# Patient Record
Sex: Female | Born: 1960 | Hispanic: Yes | Marital: Married | Smoking: Never smoker
Health system: Southern US, Community
[De-identification: ages and names within clinical notes are randomized; demographics above are authoritative.]

## PROBLEM LIST (undated history)

## (undated) ENCOUNTER — Ambulatory Visit (HOSPITAL_COMMUNITY): Discharge status: Absent without leave | Payer: BLUE CROSS/BLUE SHIELD

## (undated) DIAGNOSIS — E039 Hypothyroidism, unspecified: Secondary | ICD-10-CM

## (undated) DIAGNOSIS — B019 Varicella without complication: Secondary | ICD-10-CM

## (undated) DIAGNOSIS — F329 Major depressive disorder, single episode, unspecified: Secondary | ICD-10-CM

## (undated) DIAGNOSIS — N816 Rectocele: Secondary | ICD-10-CM

## (undated) DIAGNOSIS — E669 Obesity, unspecified: Secondary | ICD-10-CM

## (undated) DIAGNOSIS — I10 Essential (primary) hypertension: Secondary | ICD-10-CM

## (undated) DIAGNOSIS — F32A Depression, unspecified: Secondary | ICD-10-CM

## (undated) HISTORY — PX: LASER ABLATION: SHX1947

## (undated) HISTORY — PX: NEUROPLASTY / TRANSPOSITION MEDIAN NERVE AT CARPAL TUNNEL: SUR893

## (undated) HISTORY — PX: ABCESS DRAINAGE: SHX399

## (undated) HISTORY — PX: TUBAL LIGATION: SHX77

## (undated) HISTORY — PX: OTHER SURGICAL HISTORY: SHX169

## (undated) HISTORY — PX: FRACTURE SURGERY: SHX138

## (undated) HISTORY — PX: DG 2ND DIDGIT LEFT FOOT: HXRAD1641

## (undated) HISTORY — DX: Essential (primary) hypertension: I10

## (undated) HISTORY — PX: ABDOMINAL HYSTERECTOMY: SHX81

---

## 2004-06-04 ENCOUNTER — Ambulatory Visit: Payer: Self-pay | Admitting: Family Medicine

## 2004-07-23 ENCOUNTER — Ambulatory Visit: Payer: Self-pay | Admitting: Family Medicine

## 2005-03-21 ENCOUNTER — Ambulatory Visit: Payer: Self-pay | Admitting: Otolaryngology

## 2006-12-10 ENCOUNTER — Ambulatory Visit: Payer: Self-pay | Admitting: Family Medicine

## 2007-12-14 ENCOUNTER — Ambulatory Visit: Payer: Self-pay | Admitting: Family Medicine

## 2008-12-29 ENCOUNTER — Ambulatory Visit: Payer: Self-pay | Admitting: Family Medicine

## 2010-01-10 ENCOUNTER — Ambulatory Visit: Payer: Self-pay | Admitting: Internal Medicine

## 2010-12-12 ENCOUNTER — Ambulatory Visit: Payer: Self-pay | Admitting: Internal Medicine

## 2011-03-19 ENCOUNTER — Ambulatory Visit: Payer: Self-pay

## 2011-05-29 ENCOUNTER — Ambulatory Visit: Payer: Self-pay | Admitting: Obstetrics and Gynecology

## 2011-06-03 ENCOUNTER — Ambulatory Visit: Payer: Self-pay | Admitting: Obstetrics and Gynecology

## 2011-06-06 LAB — PATHOLOGY REPORT

## 2012-04-01 ENCOUNTER — Ambulatory Visit: Payer: Self-pay | Admitting: Internal Medicine

## 2013-03-08 ENCOUNTER — Ambulatory Visit: Payer: Self-pay

## 2013-04-06 ENCOUNTER — Ambulatory Visit: Payer: Self-pay

## 2013-11-30 DIAGNOSIS — E039 Hypothyroidism, unspecified: Secondary | ICD-10-CM | POA: Insufficient documentation

## 2013-11-30 DIAGNOSIS — R7989 Other specified abnormal findings of blood chemistry: Secondary | ICD-10-CM | POA: Insufficient documentation

## 2014-05-03 ENCOUNTER — Ambulatory Visit: Payer: Self-pay | Admitting: Internal Medicine

## 2014-09-21 ENCOUNTER — Ambulatory Visit: Payer: Self-pay | Admitting: Podiatry

## 2015-06-13 ENCOUNTER — Other Ambulatory Visit: Payer: Self-pay | Admitting: Internal Medicine

## 2015-06-13 DIAGNOSIS — Z1231 Encounter for screening mammogram for malignant neoplasm of breast: Secondary | ICD-10-CM

## 2015-06-27 ENCOUNTER — Ambulatory Visit
Admission: RE | Admit: 2015-06-27 | Discharge: 2015-06-27 | Disposition: A | Discharge status: Home / Self Care | Payer: 59 | Source: Ambulatory Visit | Attending: Internal Medicine | Admitting: Internal Medicine

## 2015-06-27 DIAGNOSIS — Z1231 Encounter for screening mammogram for malignant neoplasm of breast: Secondary | ICD-10-CM | POA: Diagnosis present

## 2015-10-20 ENCOUNTER — Encounter: Payer: Self-pay | Admitting: *Deleted

## 2015-10-23 ENCOUNTER — Ambulatory Visit
Admission: RE | Admit: 2015-10-23 | Discharge: 2015-10-23 | Disposition: A | Discharge status: Home / Self Care | Payer: 59 | Source: Ambulatory Visit | Attending: Gastroenterology | Admitting: Gastroenterology

## 2015-10-23 ENCOUNTER — Ambulatory Visit: Payer: 59 | Admitting: Anesthesiology

## 2015-10-23 ENCOUNTER — Encounter
Admission: RE | Disposition: A | Discharge status: Home / Self Care | Payer: Self-pay | Source: Ambulatory Visit | Attending: Gastroenterology

## 2015-10-23 DIAGNOSIS — Z79899 Other long term (current) drug therapy: Secondary | ICD-10-CM | POA: Insufficient documentation

## 2015-10-23 DIAGNOSIS — F329 Major depressive disorder, single episode, unspecified: Secondary | ICD-10-CM | POA: Insufficient documentation

## 2015-10-23 DIAGNOSIS — E669 Obesity, unspecified: Secondary | ICD-10-CM | POA: Insufficient documentation

## 2015-10-23 DIAGNOSIS — Z6829 Body mass index (BMI) 29.0-29.9, adult: Secondary | ICD-10-CM | POA: Diagnosis not present

## 2015-10-23 DIAGNOSIS — Z1211 Encounter for screening for malignant neoplasm of colon: Secondary | ICD-10-CM | POA: Diagnosis present

## 2015-10-23 DIAGNOSIS — K219 Gastro-esophageal reflux disease without esophagitis: Secondary | ICD-10-CM | POA: Diagnosis not present

## 2015-10-23 DIAGNOSIS — E039 Hypothyroidism, unspecified: Secondary | ICD-10-CM | POA: Diagnosis not present

## 2015-10-23 DIAGNOSIS — K621 Rectal polyp: Secondary | ICD-10-CM | POA: Diagnosis not present

## 2015-10-23 HISTORY — DX: Hypothyroidism, unspecified: E03.9

## 2015-10-23 HISTORY — DX: Obesity, unspecified: E66.9

## 2015-10-23 HISTORY — DX: Rectocele: N81.6

## 2015-10-23 HISTORY — DX: Major depressive disorder, single episode, unspecified: F32.9

## 2015-10-23 HISTORY — DX: Depression, unspecified: F32.A

## 2015-10-23 HISTORY — DX: Varicella without complication: B01.9

## 2015-10-23 HISTORY — PX: COLONOSCOPY WITH PROPOFOL: SHX5780

## 2015-10-23 SURGERY — COLONOSCOPY WITH PROPOFOL
Anesthesia: General

## 2015-10-23 MED ORDER — SODIUM CHLORIDE 0.9 % IV SOLN: INTRAVENOUS | Status: DC

## 2015-10-23 MED ORDER — PROPOFOL 500 MG/50ML IV EMUL
INTRAVENOUS | Status: DC | PRN
  Administered 2015-10-23: 120 ug/kg/min via INTRAVENOUS

## 2015-10-23 MED ORDER — EPHEDRINE SULFATE 50 MG/ML IJ SOLN
INTRAMUSCULAR | Status: DC | PRN
  Administered 2015-10-23: 10 mg via INTRAVENOUS

## 2015-10-23 MED ORDER — PROPOFOL 10 MG/ML IV BOLUS
INTRAVENOUS | Status: DC | PRN
  Administered 2015-10-23: 80 mg via INTRAVENOUS

## 2015-10-23 MED ORDER — SODIUM CHLORIDE 0.9 % IV SOLN
INTRAVENOUS | Status: DC
  Administered 2015-10-23: 1000 mL via INTRAVENOUS
  Administered 2015-10-23: 10:00:00 via INTRAVENOUS

## 2015-10-23 NOTE — Anesthesia Postprocedure Evaluation (Signed)
Anesthesia Post Note  Patient: Andrea LankCelia M Katzenstein  Procedure(s) Performed: Procedure(s) (LRB): COLONOSCOPY WITH PROPOFOL (N/A)  Patient location during evaluation: Endoscopy Anesthesia Type: General Level of consciousness: awake and alert Pain management: pain level controlled Vital Signs Assessment: post-procedure vital signs reviewed and stable Respiratory status: spontaneous breathing, nonlabored ventilation, respiratory function stable and patient connected to nasal cannula oxygen Cardiovascular status: blood pressure returned to baseline and stable Postop Assessment: no signs of nausea or vomiting Anesthetic complications: no    Last Vitals:  Filed Vitals:   10/23/15 1120 10/23/15 1128  BP: 119/71 114/80  Pulse: 88 88  Temp:    Resp: 17 19    Last Pain:  Filed Vitals:   10/23/15 1129  PainSc: 3                  Jomarie LongsJoseph K Presleigh Feldstein

## 2015-10-23 NOTE — Anesthesia Preprocedure Evaluation (Addendum)
Anesthesia Evaluation  Patient identified by MRN, date of birth, ID band Patient awake    Reviewed: Allergy & Precautions, H&P , NPO status , Patient's Chart, lab work & pertinent test results  History of Anesthesia Complications Negative for: history of anesthetic complications  Airway Mallampati: III  TM Distance: >3 FB Neck ROM: limited    Dental  (+) Poor Dentition, Chipped, Missing, Partial Upper   Pulmonary neg shortness of breath, asthma ,    Pulmonary exam normal breath sounds clear to auscultation       Cardiovascular Exercise Tolerance: Good (-) angina(-) Past MI and (-) DOE negative cardio ROS Normal cardiovascular exam Rhythm:regular Rate:Normal     Neuro/Psych PSYCHIATRIC DISORDERS Depression negative neurological ROS     GI/Hepatic negative GI ROS, Neg liver ROS, neg GERD  ,  Endo/Other  Hypothyroidism   Renal/GU negative Renal ROS  negative genitourinary   Musculoskeletal   Abdominal   Peds  Hematology negative hematology ROS (+)   Anesthesia Other Findings Past Medical History:   Depression                                                   Hypothyroidism                                               Obesity                                                      Rectocele                                                    Chicken pox                                                 Past Surgical History:   FRACTURE SURGERY                                              DG 2ND DIDGIT LEFT FOOT                         Left              TUBAL LIGATION                                                ABDOMINAL HYSTERECTOMY  ABCESS DRAINAGE                                               phlebectomy                                                   NEUROPLASTY / TRANSPOSITION MEDIAN NERVE AT [facility]* Bilateral             BMI    Body Mass Index   29.95 kg/m 2    Signs  and symptoms suggestive of sleep apnea    Reproductive/Obstetrics negative OB ROS                            Anesthesia Physical Anesthesia Plan  ASA: III  Anesthesia Plan: General   Post-op Pain Management:    Induction:   Airway Management Planned:   Additional Equipment:   Intra-op Plan:   Post-operative Plan:   Informed Consent: I have reviewed the patients History and Physical, chart, labs and discussed the procedure including the risks, benefits and alternatives for the proposed anesthesia with the patient or authorized representative who has indicated his/her understanding and acceptance.   Dental Advisory Given  Plan Discussed with: Anesthesiologist, CRNA and Surgeon  Anesthesia Plan Comments:         Anesthesia Quick Evaluation

## 2015-10-23 NOTE — Op Note (Signed)
[facility] Patient Name: Cornell Gaber Procedure Date: 10/23/2015 10:09 AM MRN: [id number] Account #: [id number] Date of Birth: 05-Jan-1961 Admit Type: Outpatient Age: 55 Room: [facility] ENDO ROOM 3 Gender: Female Note Status: Finalized Procedure:            Colonoscopy Indications:          Screening for colorectal malignant neoplasm, This is                        the patient's first colonoscopy Providers:            Christena Deem, MD Referring MD:         Barbette Reichmann, MD (Referring MD) Medicines:            Monitored Anesthesia Care Complications:        No immediate complications. Procedure:            Pre-Anesthesia Assessment:                       - ASA Grade Assessment: II - A patient with mild                        systemic disease.                       After obtaining informed consent, the colonoscope was                        passed under direct vision. Throughout the procedure,                        the patient's blood pressure, pulse, and oxygen                        saturations were monitored continuously. The                        Colonoscope was introduced through the anus and                        advanced to the the cecum, identified by appendiceal                        orifice and ileocecal valve. The colonoscopy was                        performed without difficulty. The patient tolerated the                        procedure well. The quality of the bowel preparation                        was good. Findings:      Two sessile polyps were found in the rectum. The polyps were less than 1       mm in size. These polyps were removed with a cold biopsy forceps.       Resection and retrieval were complete.      The exam was otherwise normal throughout the examined colon.      The retroflexed view of the distal rectum and anal verge was normal and  showed no anal or rectal abnormalities.      The digital rectal  exam was normal. Impression:           - Two less than 1 mm polyps in the rectum, removed with                        a cold biopsy forceps. Resected and retrieved.                       - The distal rectum and anal verge are normal on                        retroflexion view. Recommendation:       - Discharge patient to home.                       - Await pathology results.                       - Telephone GI clinic for pathology results in 1 week. Procedure Code(s):    --- Professional ---                       [phone], Colonoscopy, flexible; with biopsy, single or                        multiple Diagnosis Code(s):    --- Professional ---                       Z12.11, Encounter for screening for malignant neoplasm                        of colon                       K62.1, Rectal polyp CPT copyright 2016 American Medical Association. All rights reserved. The codes documented in this report are preliminary and upon coder review may  be revised to meet current compliance requirements. Christena DeemMartin U Skulskie, MD 10/23/2015 10:46:57 AM This report has been signed electronically. Number of Addenda: 0 Note Initiated On: 10/23/2015 10:09 AM Scope Withdrawal Time: 0 hours 11 minutes 48 seconds  Total Procedure Duration: 0 hours 28 minutes 52 seconds       [facility]

## 2015-10-23 NOTE — Transfer of Care (Signed)
Immediate Anesthesia Transfer of Care Note  Patient: Andrea Hudson  Procedure(s) Performed: Procedure(s): COLONOSCOPY WITH PROPOFOL (N/A)  Patient Location: PACU  Anesthesia Type:General  Level of Consciousness: awake, alert  and oriented  Airway & Oxygen Therapy: Patient Spontanous Breathing  Post-op Assessment: Report given to RN  Post vital signs: Reviewed and stable  Last Vitals:  Filed Vitals:   10/23/15 0941  BP: 136/89  Temp: 37 C  Resp: 17    Complications: No apparent anesthesia complications

## 2015-10-23 NOTE — H&P (Signed)
Outpatient short stay form Pre-procedure 10/23/2015 10:07 AM Andrea DeemMartin U Sharla Tankard MD  Primary Physician: Dr Barbette ReichmannVishwanath Hande  Reason for visit:  Screening colonoscopy  History of present illness:  Patient is a 55 year old female presenting today for a screening colonoscopy. This will first colonoscopy. She tolerated her prep well. She denies use of any aspirin or blood thinning products.    Current facility-administered medications:  .  0.9 %  sodium chloride infusion, , Intravenous, Continuous, Andrea DeemMartin U Aveleen Nevers, MD, Last Rate: 20 mL/hr at 10/23/15 1003, 1,000 mL at 10/23/15 1003 .  0.9 %  sodium chloride infusion, , Intravenous, Continuous, Andrea DeemMartin U Sharonica Kraszewski, MD  Prescriptions prior to admission  Medication Sig Dispense Refill Last Dose  . cholecalciferol (VITAMIN D) 1000 units tablet Take 1,000 Units by mouth daily.     Marland Kitchen. levothyroxine (SYNTHROID, LEVOTHROID) 75 MCG tablet Take 75 mcg by mouth daily before breakfast.        Allergies  Allergen Reactions  . Cough-Cold Medicine [Phenir-Pe-Sod Sal-Caff Cit] Other (See Comments)    Numbness of face     Past Medical History  Diagnosis Date  . Depression   . Hypothyroidism   . Obesity   . Rectocele   . Chicken pox     Review of systems:      Physical Exam    Heart and lungs: Regular rate and rhythm without rub or gallop, lungs are bilaterally clear.    HEENT: Normocephalic atraumatic eyes are anicteric    Other:     Pertinant exam for procedure: Soft nontender nondistended bowel sounds positive normoactive.    Planned proceedures: Colonoscopy and indicated procedures. I have discussed the risks benefits and complications of procedures to include not limited to bleeding, infection, perforation and the risk of sedation and the patient wishes to proceed.    Andrea DeemMartin U Emmabelle Fear, MD Gastroenterology 10/23/2015  10:07 AM

## 2015-10-24 LAB — SURGICAL PATHOLOGY

## 2015-10-25 ENCOUNTER — Encounter: Payer: Self-pay | Admitting: Gastroenterology

## 2016-03-13 ENCOUNTER — Emergency Department
Admission: EM | Admit: 2016-03-13 | Discharge: 2016-03-13 | Disposition: A | Discharge status: Home / Self Care | Payer: 59 | Attending: Emergency Medicine | Admitting: Emergency Medicine

## 2016-03-13 ENCOUNTER — Encounter: Payer: Self-pay | Admitting: Intensive Care

## 2016-03-13 ENCOUNTER — Emergency Department: Payer: 59

## 2016-03-13 DIAGNOSIS — R791 Abnormal coagulation profile: Secondary | ICD-10-CM | POA: Diagnosis not present

## 2016-03-13 DIAGNOSIS — M5414 Radiculopathy, thoracic region: Secondary | ICD-10-CM | POA: Insufficient documentation

## 2016-03-13 DIAGNOSIS — M5410 Radiculopathy, site unspecified: Secondary | ICD-10-CM

## 2016-03-13 DIAGNOSIS — E039 Hypothyroidism, unspecified: Secondary | ICD-10-CM | POA: Insufficient documentation

## 2016-03-13 DIAGNOSIS — Z79899 Other long term (current) drug therapy: Secondary | ICD-10-CM | POA: Insufficient documentation

## 2016-03-13 DIAGNOSIS — M7918 Myalgia, other site: Secondary | ICD-10-CM

## 2016-03-13 DIAGNOSIS — R200 Anesthesia of skin: Secondary | ICD-10-CM | POA: Diagnosis present

## 2016-03-13 LAB — COMPREHENSIVE METABOLIC PANEL
ALK PHOS: 76 U/L (ref 38–126)
ALT: 66 U/L — ABNORMAL HIGH (ref 14–54)
ANION GAP: 8 (ref 5–15)
AST: 64 U/L — ABNORMAL HIGH (ref 15–41)
Albumin: 4.3 g/dL (ref 3.5–5.0)
BILIRUBIN TOTAL: 0.2 mg/dL — AB (ref 0.3–1.2)
BUN: 12 mg/dL (ref 6–20)
CALCIUM: 9 mg/dL (ref 8.9–10.3)
CO2: 25 mmol/L (ref 22–32)
Chloride: 104 mmol/L (ref 101–111)
Creatinine, Ser: 0.61 mg/dL (ref 0.44–1.00)
GFR calc non Af Amer: >60 mL/min (ref >60)
Glucose, Bld: 117 mg/dL — ABNORMAL HIGH (ref 65–99)
Potassium: 4 mmol/L (ref 3.5–5.1)
Sodium: 137 mmol/L (ref 135–145)
TOTAL PROTEIN: 7.8 g/dL (ref 6.5–8.1)

## 2016-03-13 LAB — PROTIME-INR
INR: 0.99
PROTHROMBIN TIME: 13.1 s (ref 11.4–15.2)

## 2016-03-13 LAB — TROPONIN I: Troponin I: <0.03 ng/mL

## 2016-03-13 LAB — CBC
HCT: 40.2 % (ref 35.0–47.0)
Hemoglobin: 13.9 g/dL (ref 12.0–16.0)
MCH: 27.8 pg (ref 26.0–34.0)
MCHC: 34.5 g/dL (ref 32.0–36.0)
MCV: 80.4 fL (ref 80.0–100.0)
Platelets: 259 K/uL (ref 150–440)
RBC: 5 MIL/uL (ref 3.80–5.20)
RDW: 13.5 % (ref 11.5–14.5)
WBC: 6.1 K/uL (ref 3.6–11.0)

## 2016-03-13 LAB — DIFFERENTIAL
Basophils Absolute: 0 K/uL (ref 0–0.1)
Basophils Relative: 0 %
Eosinophils Absolute: 0.1 K/uL (ref 0–0.7)
Eosinophils Relative: 1 %
Lymphocytes Relative: 14 %
Lymphs Abs: 0.9 K/uL — ABNORMAL LOW (ref 1.0–3.6)
Monocytes Absolute: 0.3 K/uL (ref 0.2–0.9)
Monocytes Relative: 6 %
Neutro Abs: 4.8 K/uL (ref 1.4–6.5)
Neutrophils Relative %: 79 %

## 2016-03-13 LAB — APTT: aPTT: 30 seconds (ref 24–36)

## 2016-03-13 MED ORDER — PREDNISONE 20 MG PO TABS: 40.0000 mg | ORAL_TABLET | Freq: Every day | ORAL | 0 refills | Status: DC

## 2016-03-13 MED ORDER — TRAMADOL HCL 50 MG PO TABS: 50.0000 mg | ORAL_TABLET | Freq: Four times a day (QID) | ORAL | 0 refills | Status: DC | PRN

## 2016-03-13 NOTE — ED Notes (Signed)
Pt in via triage with complaints of upper back pain across right and left shoulders with left arm numbness since Saturday night.  Pt denies any chest pain, shortness of breath, dizziness, but does report some nausea this morning.  Pt report pain and numbness have been constant, pt reports taking tramadol and ibuprofen for pain but denies taking anything today.  Pt A/Ox4, tachycardic upon arrival, other vitals WDL, no immediate distress noted at this time.  MD at bedside.

## 2016-03-13 NOTE — ED Notes (Signed)
Patient transported to X-ray 

## 2016-03-13 NOTE — ED Triage Notes (Addendum)
Pt presents to ER with c/o back pain and L and R arm numbness that started X3 days ago. Patient reports L arm numbness is worse and L sided weakness. Pt has moderate grip with R hand and Weak grip with L hand. No weakness noted to bilateral legs. No facial droop noted. Patient reports she feels very anxious. A&O X4. Denies blurry vision. Patient ambulatory to triage

## 2016-03-13 NOTE — ED Provider Notes (Signed)
[address] [facility] Emergency Department Provider Note  Time seen: 1:32 PM  I have reviewed the triage vital signs and the nursing notes. Interpreter used for this evaluation.  HISTORY  Chief Complaint Numbness and Extremity Weakness (L arm)    HPI Andrea Hudson is a 55 y.o. female with a past medical history of depression, hypothyroidism, who presents the emergency department for bilateral upper extremity pain, and left extremity tingling/numbness. According to the patient for the past several weeks she has been experiencing mid to upper back pain with radiation into both of her upper extremities. 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 She also states she ate casing gets a numbness and tingling sensation in the left arm other denies any currently. Patient states the pain is much worse with movement of the arms or back. It is relieved when she rests and she is not at work. She states she has to work 12 hour days and the pain always starts in the mid day and worsens towards the end of the day. Patient describes the pain as moderate, located in the mid back going down both arms. Denies any recent trauma. Denies any incontinence or lower extremity issues.  Past Medical History:  Diagnosis Date  . Chicken pox   . Depression   . Hypothyroidism   . Obesity   . Rectocele     There are no active problems to display for this patient.   Past Surgical History:  Procedure Laterality Date  . ABCESS DRAINAGE    . ABDOMINAL HYSTERECTOMY    . COLONOSCOPY WITH PROPOFOL N/A 10/23/2015   Procedure: COLONOSCOPY WITH PROPOFOL;  Surgeon: Christena Deem, MD;  Location: [facility] ENDOSCOPY;  Service: Endoscopy;  Laterality: N/A;  . DG 2ND DIDGIT LEFT FOOT Left   . FRACTURE SURGERY    . NEUROPLASTY / TRANSPOSITION MEDIAN NERVE AT CARPAL TUNNEL Bilateral   . phlebectomy    . TUBAL LIGATION      Prior to Admission medications   Medication  Sig Start Date End Date Taking? Authorizing Provider  cholecalciferol (VITAMIN D) 1000 units tablet Take 1,000 Units by mouth daily.    Historical Provider, MD  levothyroxine (SYNTHROID, LEVOTHROID) 75 MCG tablet Take 75 mcg by mouth daily before breakfast.    Historical Provider, MD  montelukast (SINGULAIR) 10 MG tablet Take 10 mg by mouth at bedtime.    Historical Provider, MD    Allergies  Allergen Reactions  . Cough-Cold Medicine [Phenir-Pe-Sod Sal-Caff Cit] Other (See Comments)    Numbness of face    Family History  Problem Relation Age of Onset  . Breast cancer Neg Hx     Social History Social History  Substance Use Topics  . Smoking status: Never Smoker  . Smokeless tobacco: Never Used  . Alcohol use No    Review of Systems Constitutional: Negative for fever Cardiovascular: Negative for chest pain. Respiratory: Negative for shortness of breath. Gastrointestinal: Negative for abdominal pain Genitourinary: Negative for dysuria. Negative for incontinence. Musculoskeletal: Moderate thoracic back pain. Pain radiating down both arms. Occasionally tingling sensation in the left upper extremity. Skin: Negative for rash. Neurological: Negative for headaches, focal weakness  10-point ROS otherwise negative.  ____________________________________________   PHYSICAL EXAM:  VITAL SIGNS: ED Triage Vitals  Enc Vitals Group     BP 03/13/16 1149 115/72     Pulse Rate 03/13/16 1149 (!) 101     Resp 03/13/16 1149  18     Temp 03/13/16 1149 98.8 F (37.1 C)     Temp Source 03/13/16 1149 Oral     SpO2 03/13/16 1149 95 %     Weight 03/13/16 1148 180 lb (81.6 kg)     Height 03/13/16 1148 4\' 9"  (1.448 m)     Head Circumference --      Peak Flow --      Pain Score 03/13/16 1238 7     Pain Loc --      Pain Edu? --      Excl. in GC? --     Constitutional: Alert and oriented. Well appearing and in no distress. Eyes: Normal exam ENT   Head: Normocephalic and atraumatic.    Mouth/Throat: Mucous membranes are moist. Cardiovascular: Normal rate, regular rhythm. No murmurs, rubs, or gallops. Respiratory: Normal respiratory effort without tachypnea nor retractions. Breath sounds are clear  Gastrointestinal: Soft and nontender. No distention.   Musculoskeletal: 5/5 motor in both extremities. Good range of motion. Moderate thoracic tenderness to palpation the scene the paraspinal muscles and down bilateral trapezius muscles. Neurologic:  Normal speech and language. No gross focal neurologic deficits. Equal grip strengths bilaterally. No pronator drift. 5/5 motor in all extremities including lower extremities. Ambulates without difficulty. Skin:  Skin is warm, dry and intact.  Psychiatric: Mood and affect are normal.   ____________________________________________    EKG  EKG reviewed and interpreted by myself shows normal sinus rhythm at 99 bpm, narrow QRS, normal axis, normal intervals, nonspecific changes without ST elevation.  ____________________________________________    RADIOLOGY  X-ray negative  ____________________________________________   INITIAL IMPRESSION / ASSESSMENT AND PLAN / ED COURSE  Pertinent labs & imaging results that were available during my care of the patient were reviewed by me and considered in my medical decision making (see chart for details).  Patient presents for back pain with radiation down both of the arms. Neurological exam is intact. Overall the patient appears very well, no distress. Patient does have moderate tenderness palpation in the thoracic paraspinal muscles bilateral trapezius muscles. Highly suspect musculoskeletal pain. We'll obtain an x-ray of the thoracic spine. Patient denies any recent trauma. No concern for CVA on exam.  X-rays negative. Patient's labs are largely within normal limits. Slight LFT elevation but no abdominal pain or tenderness. We will discharge the patient home with a short course of pain  medication as well as a short steroid burst course for likely radicular type pain.  ____________________________________________   FINAL CLINICAL IMPRESSION(S) / ED DIAGNOSES  Musculoskeletal pain    Minna AntisKevin Larren Copes, MD 03/13/16 1443

## 2016-03-22 IMAGING — MG MM DIGITAL SCREENING BILAT W/ CAD
4 series · 4 of 4 positions shown · non-contrast
Comparison: Previous exam(s).

CLINICAL DATA: Screening.

EXAM:
DIGITAL SCREENING BILATERAL MAMMOGRAM WITH CAD

[L CC]
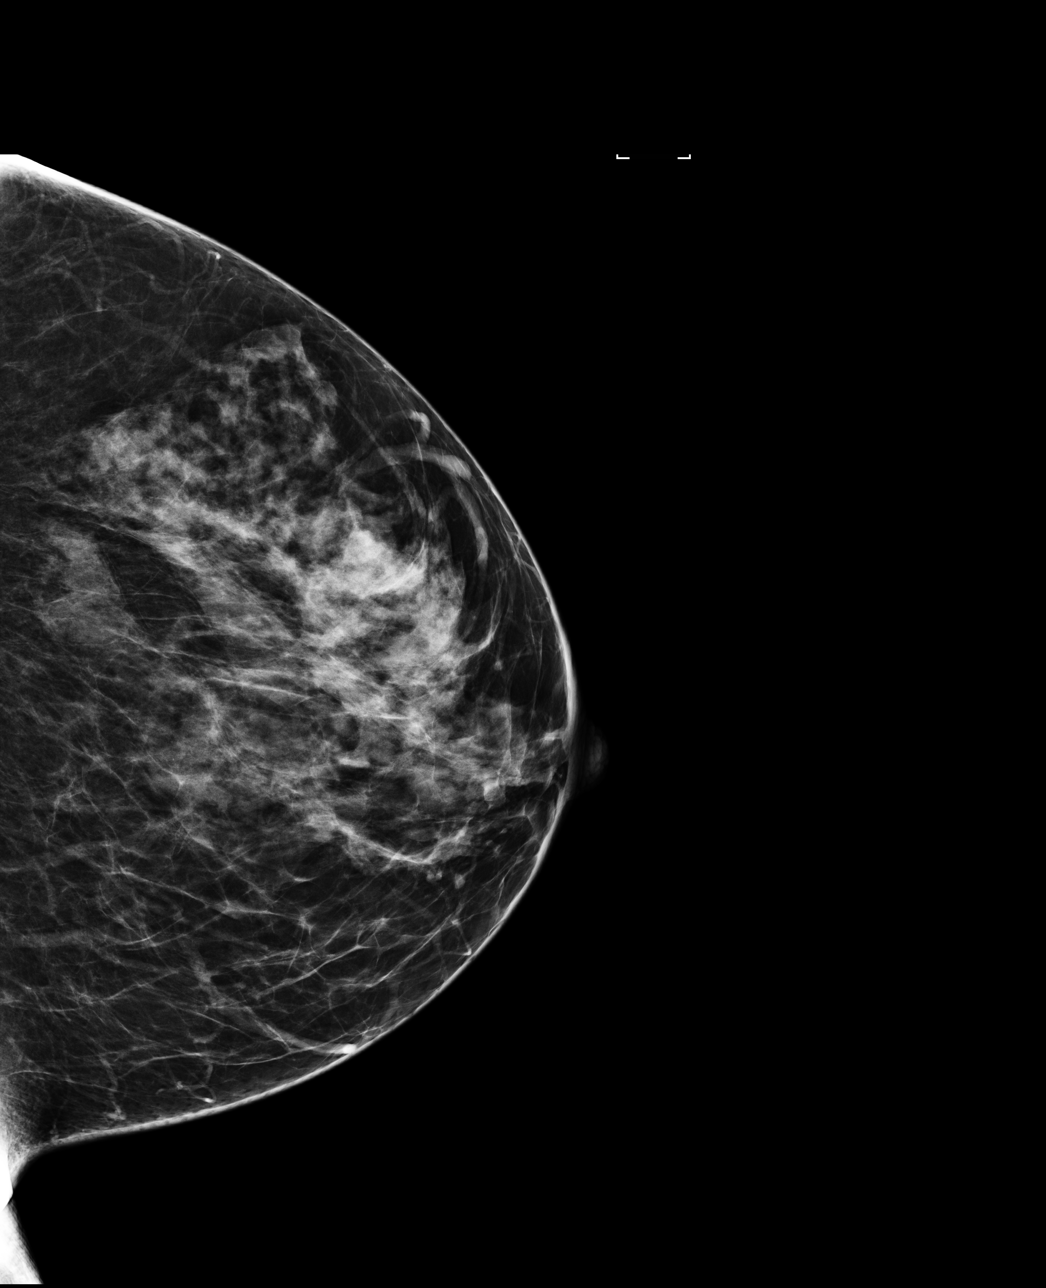

[R CC]
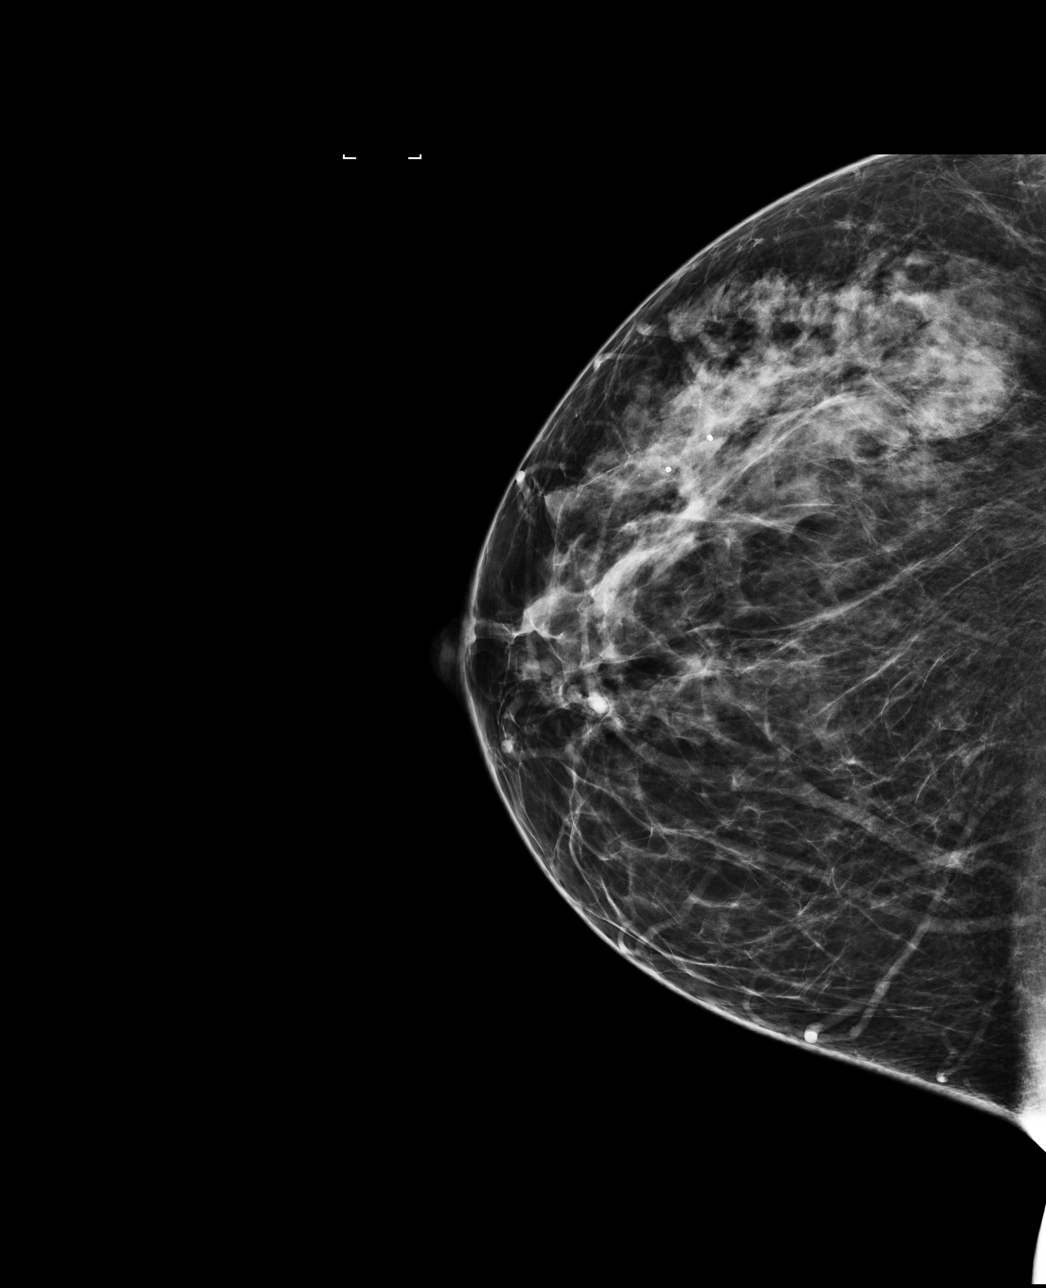

[L MLO]
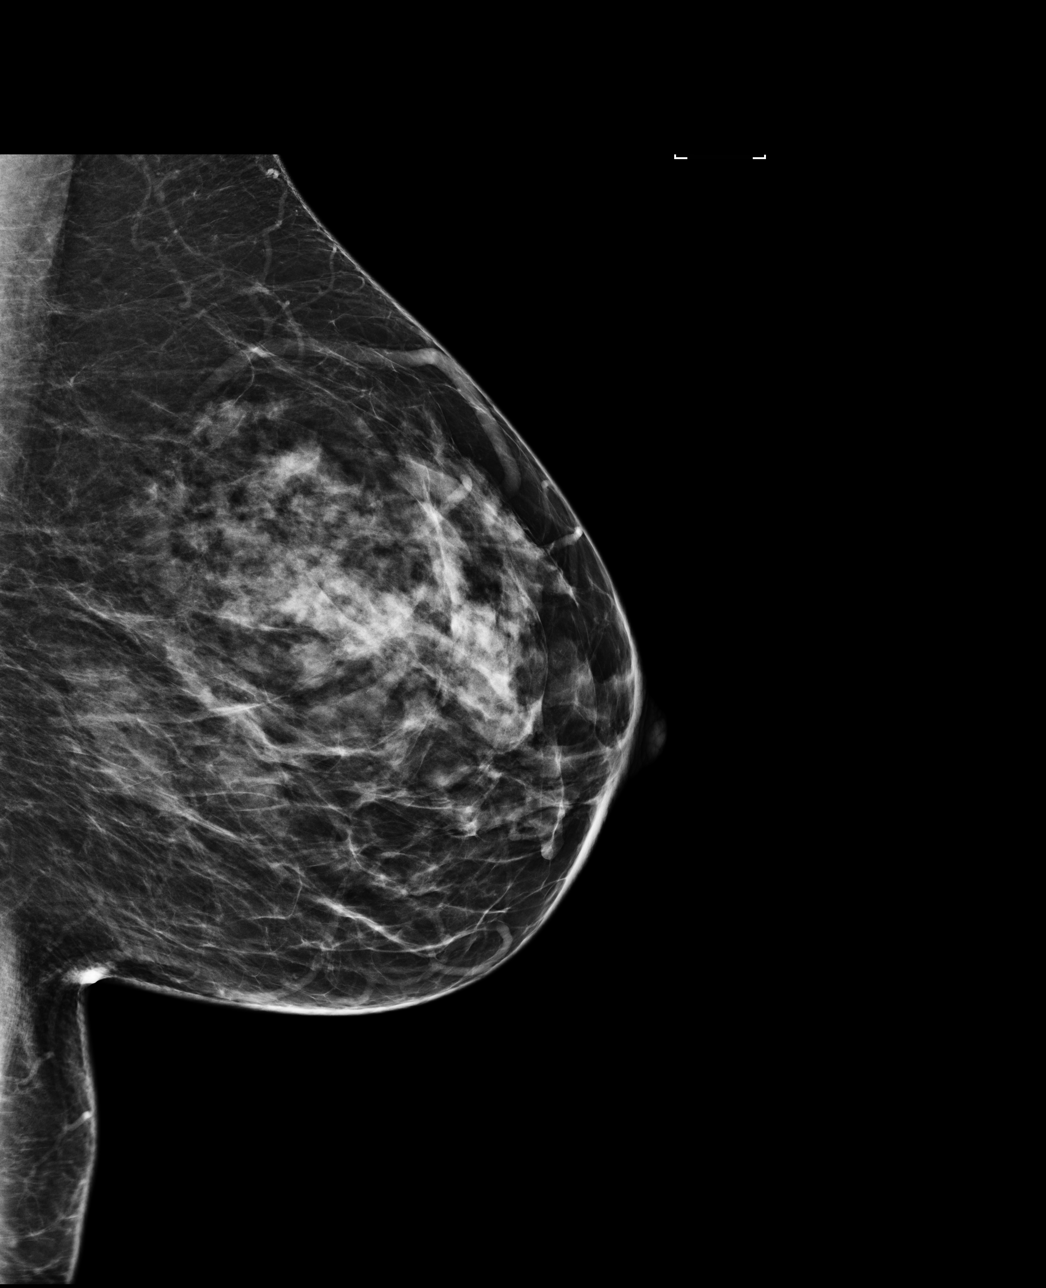

[R MLO]
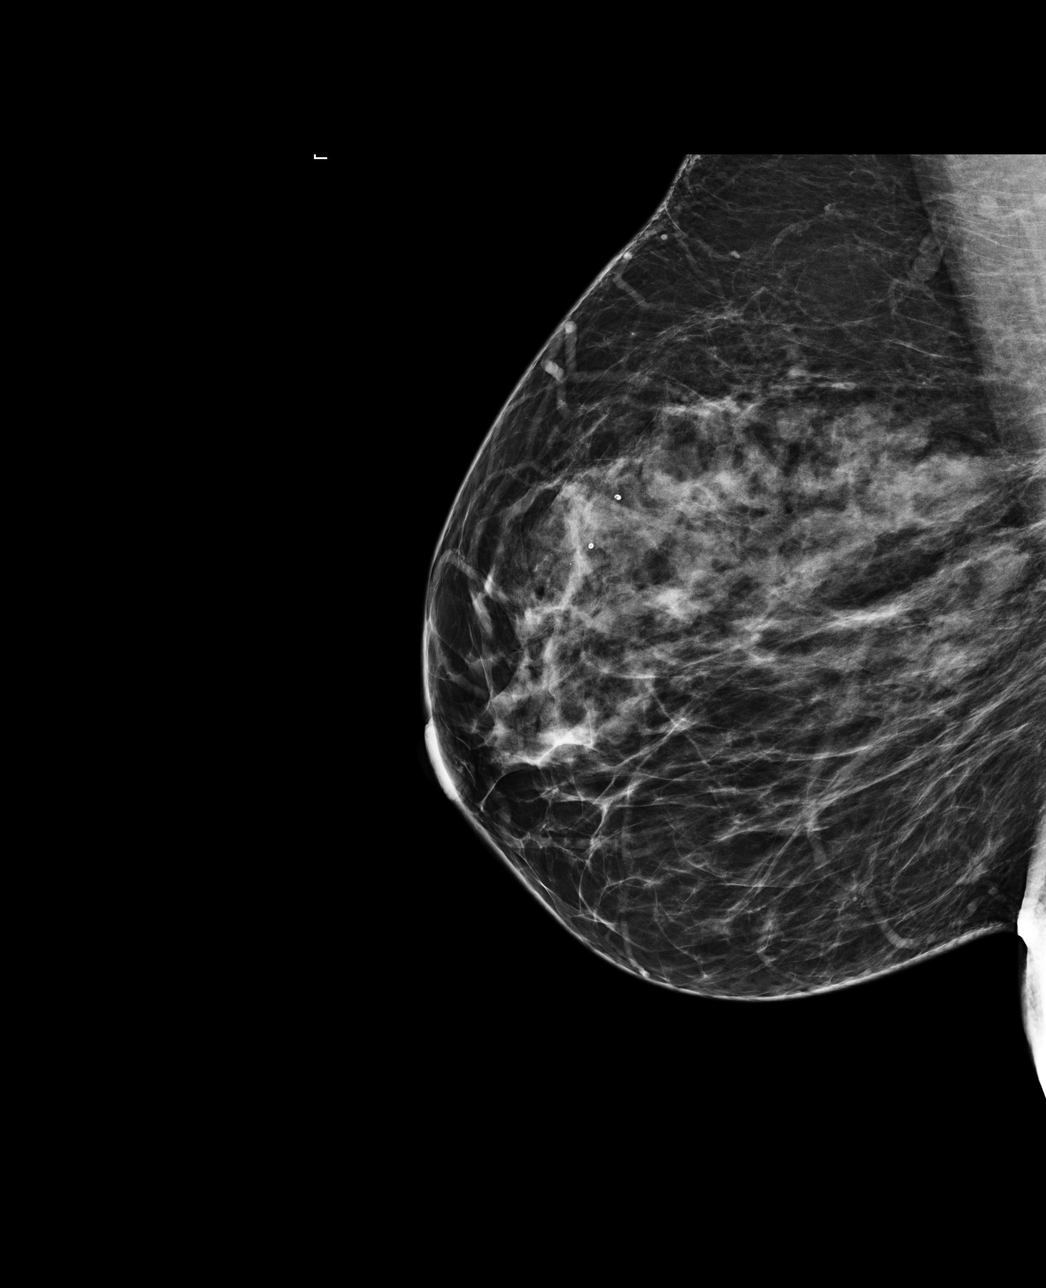

[4 of 4 positions shown; findings below may reference images not displayed]

ACR Breast Density Category c: The breast tissue is heterogeneously
dense, which may obscure small masses.
FINDINGS: There are no findings suspicious for malignancy. Images were
processed with CAD.
IMPRESSION: No mammographic evidence of malignancy. A result letter of this
screening mammogram will be mailed directly to the patient.

RECOMMENDATION:
Screening mammogram in one year. (Code:YJ-2-FEZ)

BI-RADS CATEGORY  1: Negative.

## 2016-04-08 DIAGNOSIS — R739 Hyperglycemia, unspecified: Secondary | ICD-10-CM | POA: Insufficient documentation

## 2016-04-08 DIAGNOSIS — E781 Pure hyperglyceridemia: Secondary | ICD-10-CM | POA: Insufficient documentation

## 2016-04-08 DIAGNOSIS — R200 Anesthesia of skin: Secondary | ICD-10-CM | POA: Insufficient documentation

## 2016-07-15 ENCOUNTER — Ambulatory Visit
Admission: EM | Admit: 2016-07-15 | Discharge: 2016-07-15 | Disposition: A | Discharge status: Home / Self Care | Payer: Worker's Compensation | Attending: Family Medicine | Admitting: Family Medicine

## 2016-07-15 ENCOUNTER — Ambulatory Visit (INDEPENDENT_AMBULATORY_CARE_PROVIDER_SITE_OTHER): Payer: Worker's Compensation

## 2016-07-15 DIAGNOSIS — S60221A Contusion of right hand, initial encounter: Secondary | ICD-10-CM | POA: Diagnosis not present

## 2016-07-15 NOTE — ED Notes (Signed)
Urine Drug Screen obtained in clinic

## 2016-07-15 NOTE — Discharge Instructions (Signed)
Ice, elevation Over the counter Tylenol and/or ibuprofen
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
## 2016-07-15 NOTE — ED Provider Notes (Signed)
[facility]    CSN: [id number] Arrival date & time: 07/15/16  1425     History   Chief Complaint Chief Complaint  Patient presents with  . Hand Pain    HPI Andrea Hudson is a 56 y.o. female.   56 yo female with a c/o right hand pain after injuring at work today. States she was opening the bathroom door when someone opened the door, jamming her fingers. Denies any numbness/tingling.     Hand Pain     Past Medical History:  Diagnosis Date  . Chicken pox   . Depression   . Hypothyroidism   . Obesity   . Rectocele     There are no active problems to display for this patient.   Past Surgical History:  Procedure Laterality Date  . ABCESS DRAINAGE    . ABDOMINAL HYSTERECTOMY    . COLONOSCOPY WITH PROPOFOL N/A 10/23/2015   Procedure: COLONOSCOPY WITH PROPOFOL;  Surgeon: Christena Deem, MD;  Location: [facility] ENDOSCOPY;  Service: Endoscopy;  Laterality: N/A;  . DG 2ND DIDGIT LEFT FOOT Left   . FRACTURE SURGERY    . NEUROPLASTY / TRANSPOSITION MEDIAN NERVE AT CARPAL TUNNEL Bilateral   . phlebectomy    . TUBAL LIGATION      OB History    No data available       Home Medications    Prior to Admission medications   Medication Sig Start Date End Date Taking? Authorizing Provider  cholecalciferol (VITAMIN D) 1000 units tablet Take 1,000 Units by mouth daily.   Yes Historical Provider, MD  levothyroxine (SYNTHROID, LEVOTHROID) 75 MCG tablet Take 75 mcg by mouth daily before breakfast.   Yes Historical Provider, MD  montelukast (SINGULAIR) 10 MG tablet Take 10 mg by mouth at bedtime.   Yes Historical Provider, MD  Multiple Vitamin (MULTIVITAMIN WITH MINERALS) TABS tablet Take 1 tablet by mouth daily.   Yes Historical Provider, MD  loperamide (IMODIUM) 2 MG capsule Take 2 mg by mouth as needed for diarrhea or loose stools.    Historical Provider, MD  predniSONE (DELTASONE) 20 MG tablet Take 2 tablets (40 mg total) by mouth daily. 03/13/16   Minna Antis, MD  traMADol (ULTRAM) 50 MG tablet Take 1 tablet (50 mg total) by mouth every 6 (six) hours as needed. 03/13/16   Minna Antis, MD    Family History Family History  Problem Relation Age of Onset  . Breast cancer Neg Hx     Social History Social History  Substance Use Topics  . Smoking status: Never Smoker  . Smokeless tobacco: Never Used  . Alcohol use No     Allergies   Cough-cold medicine [phenir-pe-sod sal-caff cit]   Review of Systems Review of Systems   Physical Exam Triage Vital Signs ED Triage Vitals [07/15/16 1555]  Enc Vitals Group     BP 123/80     Pulse Rate 90     Resp 18     Temp 98.9 F (37.2 C)     Temp Source Oral     SpO2 99 %     Weight      Height      Head Circumference      Peak Flow      Pain Score 5     Pain Loc      Pain Edu?      Excl. in GC?    No data found.   Updated Vital Signs BP 123/80 (BP  Location: Left Arm)   Pulse 90   Temp 98.9 F (37.2 C) (Oral)   Resp 18   SpO2 99%   Visual Acuity Right Eye Distance:   Left Eye Distance:   Bilateral Distance:    Right Eye Near:   Left Eye Near:    Bilateral Near:     Physical Exam  Constitutional: She appears well-developed and well-nourished. No distress.  Musculoskeletal: Normal range of motion.       Right hand: She exhibits tenderness and bony tenderness. She exhibits normal range of motion, normal two-point discrimination, normal capillary refill, no deformity, no laceration and no swelling. Normal sensation noted. Normal strength noted.  Skin: She is not diaphoretic.  Nursing note and vitals reviewed.    UC Treatments / Results  Labs (all labs ordered are listed, but only abnormal results are displayed) Labs Reviewed - No data to display  EKG  EKG Interpretation None       Radiology Dg Wrist Complete Right  Result Date: 07/15/2016 CLINICAL DATA:  56 year old female hit by a door. Initial encounter. EXAM: RIGHT WRIST - COMPLETE 3+ VIEW  COMPARISON:  None. FINDINGS: No fracture or dislocation. Tiny cyst of the triquetrum. IMPRESSION: No fracture or dislocation. Electronically Signed   By: Lacy DuverneySteven  Olson M.D.   On: 07/15/2016 17:19   Dg Hand Complete Right  Result Date: 07/15/2016 CLINICAL DATA:  56 year old female hit by a door. Initial encounter. EXAM: RIGHT HAND - COMPLETE 3+ VIEW COMPARISON:  None. FINDINGS: No fracture or dislocation. Mild bony overgrowth first distal phalanx. Small subchondral cyst triquetrum IMPRESSION: No fracture or dislocation. Electronically Signed   By: Lacy DuverneySteven  Olson M.D.   On: 07/15/2016 17:18    Procedures Procedures (including critical care time)  Medications Ordered in UC Medications - No data to display   Initial Impression / Assessment and Plan / UC Course  I have reviewed the triage vital signs and the nursing notes.  Pertinent labs & imaging results that were available during my care of the patient were reviewed by me and considered in my medical decision making (see chart for details).  Clinical Course       Final Clinical Impressions(s) / UC Diagnoses   Final diagnoses:  Contusion of right hand, initial encounter    New Prescriptions Discharge Medication List as of 07/15/2016  5:51 PM     1. x-ray results (negative for fracture or dislocation)  and diagnosis reviewed with patient 2. Recommend supportive treatment with ice, elevation, otc analgesics 3. Follow-up prn if symptoms worsen or don't improve   Payton Mccallumrlando Terel Bann, MD 07/15/16 [phone]

## 2016-08-13 DIAGNOSIS — M19041 Primary osteoarthritis, right hand: Secondary | ICD-10-CM | POA: Diagnosis not present

## 2016-08-13 DIAGNOSIS — M79641 Pain in right hand: Secondary | ICD-10-CM | POA: Diagnosis not present

## 2016-08-13 DIAGNOSIS — M19042 Primary osteoarthritis, left hand: Secondary | ICD-10-CM | POA: Diagnosis not present

## 2016-08-13 DIAGNOSIS — E781 Pure hyperglyceridemia: Secondary | ICD-10-CM | POA: Diagnosis not present

## 2016-08-13 DIAGNOSIS — M19049 Primary osteoarthritis, unspecified hand: Secondary | ICD-10-CM | POA: Diagnosis not present

## 2016-08-13 DIAGNOSIS — E039 Hypothyroidism, unspecified: Secondary | ICD-10-CM | POA: Diagnosis not present

## 2016-09-30 DIAGNOSIS — E039 Hypothyroidism, unspecified: Secondary | ICD-10-CM | POA: Diagnosis not present

## 2016-09-30 DIAGNOSIS — R7989 Other specified abnormal findings of blood chemistry: Secondary | ICD-10-CM | POA: Diagnosis not present

## 2016-09-30 DIAGNOSIS — R739 Hyperglycemia, unspecified: Secondary | ICD-10-CM | POA: Diagnosis not present

## 2016-10-07 DIAGNOSIS — R739 Hyperglycemia, unspecified: Secondary | ICD-10-CM | POA: Diagnosis not present

## 2016-10-07 DIAGNOSIS — Z0000 Encounter for general adult medical examination without abnormal findings: Secondary | ICD-10-CM | POA: Diagnosis not present

## 2016-10-07 DIAGNOSIS — M159 Polyosteoarthritis, unspecified: Secondary | ICD-10-CM | POA: Diagnosis not present

## 2016-10-07 DIAGNOSIS — E039 Hypothyroidism, unspecified: Secondary | ICD-10-CM | POA: Diagnosis not present

## 2016-10-10 ENCOUNTER — Other Ambulatory Visit: Payer: Self-pay | Admitting: Internal Medicine

## 2016-10-10 DIAGNOSIS — Z1231 Encounter for screening mammogram for malignant neoplasm of breast: Secondary | ICD-10-CM

## 2016-10-28 DIAGNOSIS — R300 Dysuria: Secondary | ICD-10-CM | POA: Diagnosis not present

## 2016-10-28 DIAGNOSIS — M545 Low back pain: Secondary | ICD-10-CM | POA: Diagnosis not present

## 2016-12-10 ENCOUNTER — Ambulatory Visit
Admission: RE | Admit: 2016-12-10 | Discharge: 2016-12-10 | Disposition: A | Discharge status: Home / Self Care | Payer: 59 | Source: Ambulatory Visit | Attending: Internal Medicine | Admitting: Internal Medicine

## 2016-12-10 DIAGNOSIS — Z1231 Encounter for screening mammogram for malignant neoplasm of breast: Secondary | ICD-10-CM | POA: Insufficient documentation

## 2017-01-20 DIAGNOSIS — M659 Synovitis and tenosynovitis, unspecified: Secondary | ICD-10-CM | POA: Diagnosis not present

## 2017-01-20 DIAGNOSIS — M79671 Pain in right foot: Secondary | ICD-10-CM | POA: Diagnosis not present

## 2017-01-30 DIAGNOSIS — Z0000 Encounter for general adult medical examination without abnormal findings: Secondary | ICD-10-CM | POA: Diagnosis not present

## 2017-01-30 DIAGNOSIS — R739 Hyperglycemia, unspecified: Secondary | ICD-10-CM | POA: Diagnosis not present

## 2017-01-30 DIAGNOSIS — E039 Hypothyroidism, unspecified: Secondary | ICD-10-CM | POA: Diagnosis not present

## 2017-02-06 DIAGNOSIS — E039 Hypothyroidism, unspecified: Secondary | ICD-10-CM | POA: Diagnosis not present

## 2017-02-06 DIAGNOSIS — N343 Urethral syndrome, unspecified: Secondary | ICD-10-CM | POA: Diagnosis not present

## 2017-02-06 DIAGNOSIS — E781 Pure hyperglyceridemia: Secondary | ICD-10-CM | POA: Diagnosis not present

## 2017-04-18 DIAGNOSIS — Z23 Encounter for immunization: Secondary | ICD-10-CM | POA: Diagnosis not present

## 2017-04-30 DIAGNOSIS — E039 Hypothyroidism, unspecified: Secondary | ICD-10-CM | POA: Diagnosis not present

## 2017-04-30 DIAGNOSIS — J069 Acute upper respiratory infection, unspecified: Secondary | ICD-10-CM | POA: Diagnosis not present

## 2017-05-07 DIAGNOSIS — R030 Elevated blood-pressure reading, without diagnosis of hypertension: Secondary | ICD-10-CM | POA: Diagnosis not present

## 2017-07-02 DIAGNOSIS — R05 Cough: Secondary | ICD-10-CM | POA: Diagnosis not present

## 2017-07-02 DIAGNOSIS — J019 Acute sinusitis, unspecified: Secondary | ICD-10-CM | POA: Diagnosis not present

## 2017-07-02 DIAGNOSIS — R509 Fever, unspecified: Secondary | ICD-10-CM | POA: Diagnosis not present

## 2017-08-04 DIAGNOSIS — N343 Urethral syndrome, unspecified: Secondary | ICD-10-CM | POA: Diagnosis not present

## 2017-08-04 DIAGNOSIS — E039 Hypothyroidism, unspecified: Secondary | ICD-10-CM | POA: Diagnosis not present

## 2017-08-04 DIAGNOSIS — E781 Pure hyperglyceridemia: Secondary | ICD-10-CM | POA: Diagnosis not present

## 2017-08-11 DIAGNOSIS — M25511 Pain in right shoulder: Secondary | ICD-10-CM | POA: Diagnosis not present

## 2017-08-11 DIAGNOSIS — E781 Pure hyperglyceridemia: Secondary | ICD-10-CM | POA: Diagnosis not present

## 2017-08-11 DIAGNOSIS — E039 Hypothyroidism, unspecified: Secondary | ICD-10-CM | POA: Diagnosis not present

## 2017-08-23 DIAGNOSIS — J069 Acute upper respiratory infection, unspecified: Secondary | ICD-10-CM | POA: Diagnosis not present

## 2017-08-23 DIAGNOSIS — J209 Acute bronchitis, unspecified: Secondary | ICD-10-CM | POA: Diagnosis not present

## 2017-10-11 DIAGNOSIS — J0110 Acute frontal sinusitis, unspecified: Secondary | ICD-10-CM | POA: Diagnosis not present

## 2017-12-25 ENCOUNTER — Other Ambulatory Visit: Payer: Self-pay | Admitting: Internal Medicine

## 2017-12-25 DIAGNOSIS — Z1231 Encounter for screening mammogram for malignant neoplasm of breast: Secondary | ICD-10-CM

## 2018-01-14 ENCOUNTER — Ambulatory Visit
Admission: RE | Admit: 2018-01-14 | Discharge: 2018-01-14 | Disposition: A | Discharge status: Home / Self Care | Payer: 59 | Source: Ambulatory Visit | Attending: Internal Medicine | Admitting: Internal Medicine

## 2018-01-14 DIAGNOSIS — Z1231 Encounter for screening mammogram for malignant neoplasm of breast: Secondary | ICD-10-CM | POA: Insufficient documentation

## 2018-02-05 DIAGNOSIS — E781 Pure hyperglyceridemia: Secondary | ICD-10-CM | POA: Diagnosis not present

## 2018-02-05 DIAGNOSIS — M25511 Pain in right shoulder: Secondary | ICD-10-CM | POA: Diagnosis not present

## 2018-02-05 DIAGNOSIS — R7989 Other specified abnormal findings of blood chemistry: Secondary | ICD-10-CM | POA: Diagnosis not present

## 2018-02-05 DIAGNOSIS — E039 Hypothyroidism, unspecified: Secondary | ICD-10-CM | POA: Diagnosis not present

## 2018-03-09 DIAGNOSIS — S60041A Contusion of right ring finger without damage to nail, initial encounter: Secondary | ICD-10-CM | POA: Diagnosis not present

## 2018-03-09 DIAGNOSIS — M25511 Pain in right shoulder: Secondary | ICD-10-CM | POA: Diagnosis not present

## 2018-03-09 DIAGNOSIS — M542 Cervicalgia: Secondary | ICD-10-CM | POA: Diagnosis not present

## 2018-03-25 DIAGNOSIS — Z23 Encounter for immunization: Secondary | ICD-10-CM | POA: Diagnosis not present

## 2018-03-25 DIAGNOSIS — E039 Hypothyroidism, unspecified: Secondary | ICD-10-CM | POA: Diagnosis not present

## 2018-03-25 DIAGNOSIS — Z0000 Encounter for general adult medical examination without abnormal findings: Secondary | ICD-10-CM | POA: Diagnosis not present

## 2018-09-21 DIAGNOSIS — E039 Hypothyroidism, unspecified: Secondary | ICD-10-CM | POA: Diagnosis not present

## 2018-09-21 DIAGNOSIS — Z0000 Encounter for general adult medical examination without abnormal findings: Secondary | ICD-10-CM | POA: Diagnosis not present

## 2018-09-21 DIAGNOSIS — R739 Hyperglycemia, unspecified: Secondary | ICD-10-CM | POA: Diagnosis not present

## 2018-09-28 DIAGNOSIS — R7309 Other abnormal glucose: Secondary | ICD-10-CM | POA: Diagnosis not present

## 2018-09-28 DIAGNOSIS — R7989 Other specified abnormal findings of blood chemistry: Secondary | ICD-10-CM | POA: Diagnosis not present

## 2018-09-28 DIAGNOSIS — J4520 Mild intermittent asthma, uncomplicated: Secondary | ICD-10-CM | POA: Insufficient documentation

## 2018-09-28 DIAGNOSIS — E039 Hypothyroidism, unspecified: Secondary | ICD-10-CM | POA: Diagnosis not present

## 2018-09-28 DIAGNOSIS — Z23 Encounter for immunization: Secondary | ICD-10-CM | POA: Diagnosis not present

## 2019-01-29 ENCOUNTER — Other Ambulatory Visit: Payer: Self-pay | Admitting: Internal Medicine

## 2019-01-29 DIAGNOSIS — Z1231 Encounter for screening mammogram for malignant neoplasm of breast: Secondary | ICD-10-CM

## 2019-03-22 ENCOUNTER — Ambulatory Visit
Admission: RE | Admit: 2019-03-22 | Discharge: 2019-03-22 | Disposition: A | Discharge status: Home / Self Care | Payer: 59 | Source: Ambulatory Visit | Attending: Internal Medicine | Admitting: Internal Medicine

## 2019-03-22 DIAGNOSIS — Z1231 Encounter for screening mammogram for malignant neoplasm of breast: Secondary | ICD-10-CM | POA: Insufficient documentation

## 2019-06-12 ENCOUNTER — Ambulatory Visit
Admission: EM | Admit: 2019-06-12 | Discharge: 2019-06-12 | Disposition: A | Discharge status: Home / Self Care | Payer: No Typology Code available for payment source | Attending: Family Medicine | Admitting: Family Medicine

## 2019-06-12 ENCOUNTER — Other Ambulatory Visit: Payer: Self-pay

## 2019-06-12 ENCOUNTER — Encounter: Payer: Self-pay | Admitting: Gynecology

## 2019-06-12 DIAGNOSIS — M25511 Pain in right shoulder: Secondary | ICD-10-CM

## 2019-06-12 DIAGNOSIS — M546 Pain in thoracic spine: Secondary | ICD-10-CM

## 2019-06-12 DIAGNOSIS — M25512 Pain in left shoulder: Secondary | ICD-10-CM

## 2019-06-12 DIAGNOSIS — R0789 Other chest pain: Secondary | ICD-10-CM | POA: Diagnosis not present

## 2019-06-12 DIAGNOSIS — M7918 Myalgia, other site: Secondary | ICD-10-CM

## 2019-06-12 DIAGNOSIS — M542 Cervicalgia: Secondary | ICD-10-CM | POA: Diagnosis not present

## 2019-06-12 MED ORDER — MELOXICAM 15 MG PO TABS: 15.0000 mg | ORAL_TABLET | Freq: Every day | ORAL | 0 refills | Status: DC | PRN

## 2019-06-12 MED ORDER — TIZANIDINE HCL 4 MG PO TABS: 4.0000 mg | ORAL_TABLET | Freq: Three times a day (TID) | ORAL | 0 refills | Status: DC | PRN

## 2019-06-12 MED ORDER — ONDANSETRON HCL 4 MG PO TABS: 4.0000 mg | ORAL_TABLET | Freq: Three times a day (TID) | ORAL | 0 refills | Status: DC | PRN
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
## 2019-06-12 NOTE — Discharge Instructions (Signed)
Rest.  Medications as directed.  Take care  Dr. Lacinda Axon
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
## 2019-08-17 ENCOUNTER — Ambulatory Visit: Payer: BLUE CROSS/BLUE SHIELD | Admitting: Physical Therapy

## 2019-10-19 ENCOUNTER — Other Ambulatory Visit (INDEPENDENT_AMBULATORY_CARE_PROVIDER_SITE_OTHER): Payer: Self-pay | Admitting: Nurse Practitioner

## 2019-10-19 DIAGNOSIS — I8390 Asymptomatic varicose veins of unspecified lower extremity: Secondary | ICD-10-CM

## 2019-10-20 ENCOUNTER — Ambulatory Visit (INDEPENDENT_AMBULATORY_CARE_PROVIDER_SITE_OTHER): Payer: BC Managed Care – PPO

## 2019-10-20 ENCOUNTER — Ambulatory Visit (INDEPENDENT_AMBULATORY_CARE_PROVIDER_SITE_OTHER): Payer: BC Managed Care – PPO | Admitting: Nurse Practitioner

## 2019-10-20 ENCOUNTER — Other Ambulatory Visit: Payer: Self-pay

## 2019-10-20 ENCOUNTER — Encounter (INDEPENDENT_AMBULATORY_CARE_PROVIDER_SITE_OTHER): Payer: Self-pay | Admitting: Nurse Practitioner

## 2019-10-20 VITALS — BP 123/78 | HR 83 | Ht 60.0 in | Wt 181.0 lb

## 2019-10-20 DIAGNOSIS — I83813 Varicose veins of bilateral lower extremities with pain: Secondary | ICD-10-CM | POA: Diagnosis not present

## 2019-10-20 DIAGNOSIS — I8390 Asymptomatic varicose veins of unspecified lower extremity: Secondary | ICD-10-CM

## 2019-10-20 DIAGNOSIS — J4520 Mild intermittent asthma, uncomplicated: Secondary | ICD-10-CM | POA: Diagnosis not present

## 2019-10-20 MED ORDER — IBUPROFEN 800 MG PO TABS: 800.0000 mg | ORAL_TABLET | Freq: Three times a day (TID) | ORAL | 1 refills | Status: DC | PRN

## 2019-10-20 NOTE — Progress Notes (Signed)
Subjective:    Patient ID: Andrea Hudson, female    DOB: [birth year], 59 y.o.   MRN: [id number] Chief Complaint  Patient presents with  . New Patient (Initial Visit)    Varicose Veins BLE VEN reflux    The patient is seen for evaluation of symptomatic varicose veins. The patient relates burning and stinging which worsened steadily throughout the course of the day, particularly with standing. The patient also notes an aching and throbbing pain over the varicosities, particularly with prolonged dependent positions. The symptoms are significantly improved with elevation.  The patient also notes that during hot weather the symptoms are greatly intensified. The patient states the pain from the varicose veins interferes with work, daily exercise, shopping and household maintenance. At this point, the symptoms are persistent and severe enough that they're having a negative impact on lifestyle and are interfering with daily activities.  The patient reports having her bilateral veins treated several years ago in [place].  Based upon her description it sounds as if it may have been an endovenous laser ablation.  The patient has continued to her medical grade 1 compression stockings since that time.  For well after her procedure her lower extremities feel much better however this pain came back about 6 months ago with the left lower extremity being worse than the right.  There is no history of DVT, PE or superficial thrombophlebitis. There is no history of ulceration or hemorrhage. The patient denies a significant family history of varicose veins.   Today the patient underwent noninvasive studies which shows venous insufficiency in the right common femoral vein and the left popliteal vein.  The patient has reflux in the right great saphenous vein at the mid thigh extending to the distal thigh.  The vein diameters measure from 0.33-0.46.  There is also reflux in the right small saphenous vein.  The left  lower extremity has reflux in the great saphenous vein starting at the saphenofemoral junction extending to the proximal calf.  The vein diameters measure from 0.39 cm to 0.61 cm.   Review of Systems  Cardiovascular: Positive for leg swelling.  Neurological:       Tenderness  All other systems reviewed and are negative.      Objective:   Physical Exam Vitals reviewed. Exam conducted with a chaperone present (Interpreter present).  Constitutional:      Appearance: Normal appearance.  HENT:     Head: Normocephalic.  Cardiovascular:     Rate and Rhythm: Normal rate and regular rhythm.     Pulses:          Dorsalis pedis pulses are 1+ on the right side and 1+ on the left side.       Posterior tibial pulses are 1+ on the right side and 1+ on the left side.     Comments: Scattered spider veins bilaterally.  Large varicosities 3 to 4 mm in size in the left lower extremity. Musculoskeletal:     Right lower leg: 1+ Edema present.     Left lower leg: 1+ Edema present.  Neurological:     Mental Status: She is alert.  Psychiatric:        Mood and Affect: Mood normal.        Behavior: Behavior normal.        Thought Content: Thought content normal.        Judgment: Judgment normal.     BP 123/78   Pulse 83   Ht 5' (1.524  m)   Wt 181 lb (82.1 kg)   BMI 35.35 kg/m   Past Medical History:  Diagnosis Date  . Chicken pox   . Depression   . Hypertension   . Hypothyroidism   . Obesity   . Rectocele     Social History   Socioeconomic History  . Marital status: Married    Spouse name: Not on file  . Number of children: Not on file  . Years of education: Not on file  . Highest education level: Not on file  Occupational History  . Not on file  Tobacco Use  . Smoking status: Never Smoker  . Smokeless tobacco: Never Used  Substance and Sexual Activity  . Alcohol use: No  . Drug use: No  . Sexual activity: Not on file  Other Topics Concern  . Not on file  Social History  Narrative  . Not on file   Social Determinants of Health   Financial Resource Strain:   . Difficulty of Paying Living Expenses:   Food Insecurity:   . Worried About Programme researcher, broadcasting/film/video in the Last Year:   . Barista in the Last Year:   Transportation Needs:   . Freight forwarder (Medical):   Marland Kitchen Lack of Transportation (Non-Medical):   Physical Activity:   . Days of Exercise per Week:   . Minutes of Exercise per Session:   Stress:   . Feeling of Stress :   Social Connections:   . Frequency of Communication with Friends and Family:   . Frequency of Social Gatherings with Friends and Family:   . Attends Religious Services:   . Active Member of Clubs or Organizations:   . Attends Banker Meetings:   Marland Kitchen Marital Status:   Intimate Partner Violence:   . Fear of Current or Ex-Partner:   . Emotionally Abused:   Marland Kitchen Physically Abused:   . Sexually Abused:     Past Surgical History:  Procedure Laterality Date  . ABCESS DRAINAGE    . ABDOMINAL HYSTERECTOMY    . COLONOSCOPY WITH PROPOFOL N/A 10/23/2015   Procedure: COLONOSCOPY WITH PROPOFOL;  Surgeon: Christena Deem, MD;  Location: [facility] ENDOSCOPY;  Service: Endoscopy;  Laterality: N/A;  . DG 2ND DIDGIT LEFT FOOT Left   . FRACTURE SURGERY    . NEUROPLASTY / TRANSPOSITION MEDIAN NERVE AT CARPAL TUNNEL Bilateral   . phlebectomy    . TUBAL LIGATION      Family History  Problem Relation Age of Onset  . Breast cancer Neg Hx     Allergies  Allergen Reactions  . Cough-Cold Medicine [Phenir-Pe-Sod Sal-Caff Cit] Other (See Comments)    Numbness of face  . Montelukast Other (See Comments)    Dryness of the mouth. Rx has become ineffective  . Other Other (See Comments)    OTC cold meds and cough meds Causes face numbness       Assessment & Plan:   1. Varicose veins of both lower extremities with pain Recommend  I have reviewed my previous  discussion with the patient regarding  varicose veins and why  they cause symptoms. Patient will continue  wearing graduated compression stockings class 1 on a daily basis, beginning first thing in the morning and removing them in the evening.    In addition, behavioral modification including elevation during the day was again discussed and this will continue.  The patient has utilized over the counter pain medications and has been exercising.  However, at this time conservative therapy has not alleviated the patient's symptoms of leg pain and swelling  Recommend: laser ablation of the right and  left great saphenous veins to eliminate the symptoms of pain and swelling of the lower extremities caused by the severe superficial venous reflux disease.   If in the instance that the right saphenous vein is not amenable to laser ablation we will proceed with foam sclerotherapy instead.  This was discussed in detail with the patient and she understands and agrees.  2. Mild intermittent asthma without complication Continue pulmonary medications and aerosols as already ordered, these medications have been reviewed and there are no changes at this time.     Current Outpatient Medications on File Prior to Visit  Medication Sig Dispense Refill  . cholecalciferol (VITAMIN D) 1000 units tablet Take 1,000 Units by mouth daily.    . citalopram (CELEXA) 10 MG tablet Take 10 mg by mouth daily.    Marland Kitchen levothyroxine (SYNTHROID, LEVOTHROID) 75 MCG tablet Take 75 mcg by mouth daily before breakfast.    . montelukast (SINGULAIR) 10 MG tablet Take 10 mg by mouth at bedtime.    . Multiple Vitamin (MULTIVITAMIN WITH MINERALS) TABS tablet Take 1 tablet by mouth daily.    . Vitamin E 100 units TABS Take by mouth.    . loperamide (IMODIUM) 2 MG capsule Take 2 mg by mouth as needed for diarrhea or loose stools.    . meloxicam (MOBIC) 15 MG tablet Take 1 tablet (15 mg total) by mouth daily as needed for pain. (Patient not taking: Reported on 10/20/2019) 30 tablet 0  . ondansetron  (ZOFRAN) 4 MG tablet Take 1 tablet (4 mg total) by mouth every 8 (eight) hours as needed for nausea or vomiting. (Patient not taking: Reported on 10/20/2019) 20 tablet 0  . tiZANidine (ZANAFLEX) 4 MG tablet Take 1 tablet (4 mg total) by mouth every 8 (eight) hours as needed for muscle spasms. (Patient not taking: Reported on 10/20/2019) 30 tablet 0   No current facility-administered medications on file prior to visit.    There are no Patient Instructions on file for this visit. No follow-ups on file.   Georgiana Spinner, NP  ibuprofe

## 2019-10-21 ENCOUNTER — Encounter (INDEPENDENT_AMBULATORY_CARE_PROVIDER_SITE_OTHER): Payer: Self-pay | Admitting: Nurse Practitioner

## 2020-01-04 ENCOUNTER — Other Ambulatory Visit (INDEPENDENT_AMBULATORY_CARE_PROVIDER_SITE_OTHER): Payer: BC Managed Care – PPO | Admitting: Vascular Surgery

## 2020-01-07 ENCOUNTER — Other Ambulatory Visit: Payer: Self-pay

## 2020-01-07 ENCOUNTER — Ambulatory Visit (INDEPENDENT_AMBULATORY_CARE_PROVIDER_SITE_OTHER): Payer: BC Managed Care – PPO | Admitting: Vascular Surgery

## 2020-01-07 ENCOUNTER — Encounter (INDEPENDENT_AMBULATORY_CARE_PROVIDER_SITE_OTHER): Payer: Self-pay | Admitting: Vascular Surgery

## 2020-01-07 ENCOUNTER — Encounter (INDEPENDENT_AMBULATORY_CARE_PROVIDER_SITE_OTHER): Payer: BC Managed Care – PPO

## 2020-01-07 DIAGNOSIS — I83812 Varicose veins of left lower extremities with pain: Secondary | ICD-10-CM | POA: Diagnosis not present

## 2020-01-07 NOTE — Progress Notes (Signed)
Andrea Hudson is a 59 y.o. female who presents with symptomatic venous reflux  Past Medical History:  Diagnosis Date  . Chicken pox   . Depression   . Hypertension   . Hypothyroidism   . Obesity   . Rectocele     Past Surgical History:  Procedure Laterality Date  . ABCESS DRAINAGE    . ABDOMINAL HYSTERECTOMY    . COLONOSCOPY WITH PROPOFOL N/A 10/23/2015   Procedure: COLONOSCOPY WITH PROPOFOL;  Surgeon: Christena Deem, MD;  Location: [facility] ENDOSCOPY;  Service: Endoscopy;  Laterality: N/A;  . DG 2ND DIDGIT LEFT FOOT Left   . FRACTURE SURGERY    . NEUROPLASTY / TRANSPOSITION MEDIAN NERVE AT CARPAL TUNNEL Bilateral   . phlebectomy    . TUBAL LIGATION       Current Outpatient Medications:  .  cholecalciferol (VITAMIN D) 1000 units tablet, Take 1,000 Units by mouth daily., Disp: , Rfl:  .  citalopram (CELEXA) 10 MG tablet, Take 10 mg by mouth daily., Disp: , Rfl:  .  ibuprofen (ADVIL) 800 MG tablet, Take 1 tablet (800 mg total) by mouth every 8 (eight) hours as needed., Disp: 30 tablet, Rfl: 1 .  levothyroxine (SYNTHROID, LEVOTHROID) 75 MCG tablet, Take 75 mcg by mouth daily before breakfast., Disp: , Rfl:  .  loperamide (IMODIUM) 2 MG capsule, Take 2 mg by mouth as needed for diarrhea or loose stools., Disp: , Rfl:  .  montelukast (SINGULAIR) 10 MG tablet, Take 10 mg by mouth at bedtime., Disp: , Rfl:  .  Multiple Vitamin (MULTIVITAMIN WITH MINERALS) TABS tablet, Take 1 tablet by mouth daily., Disp: , Rfl:  .  Vitamin E 100 units TABS, Take by mouth., Disp: , Rfl:  .  meloxicam (MOBIC) 15 MG tablet, Take 1 tablet (15 mg total) by mouth daily as needed for pain. (Patient not taking: Reported on 10/20/2019), Disp: 30 tablet, Rfl: 0 .  ondansetron (ZOFRAN) 4 MG tablet, Take 1 tablet (4 mg total) by mouth every 8 (eight) hours as needed for nausea or vomiting. (Patient not taking: Reported on 10/20/2019), Disp: 20 tablet, Rfl: 0 .  tiZANidine (ZANAFLEX) 4 MG tablet, Take 1 tablet (4 mg  total) by mouth every 8 (eight) hours as needed for muscle spasms. (Patient not taking: Reported on 10/20/2019), Disp: 30 tablet, Rfl: 0  Allergies  Allergen Reactions  . Cough-Cold Medicine [Phenir-Pe-Sod Sal-Caff Cit] Other (See Comments)    Numbness of face  . Montelukast Other (See Comments)    Dryness of the mouth. Rx has become ineffective  . Other Other (See Comments)    OTC cold meds and cough meds Causes face numbness     Varicose veins of leg with pain, left     PLAN: The patient's left lower extremity was sterilely prepped and draped. The ultrasound machine was used to visualize the saphenous vein throughout its course. A segment just below the knee was selected for access. The saphenous vein was accessed without difficulty using ultrasound guidance with a micropuncture needle. A 0.018 wire was then placed beyond the saphenofemoral junction and the needle was removed. The 65 cm sheath was then placed over the wire and the wire and dilator were removed. The laser fiber was then placed through the sheath and its tip was placed approximately 4-5 centimeters below the saphenofemoral junction. Tumescent anesthesia was then created with a dilute lidocaine solution. Laser energy was then delivered with constant withdrawal of the sheath and laser fiber. Approximately 1125 joules  of energy were delivered over a length of 30 centimeters using a 1470 Hz VenaCure machine at 7 W. Sterile dressings were placed. The patient tolerated the procedure well without obvious complications.   Follow-up in 1 week with post-laser duplex.

## 2020-01-10 ENCOUNTER — Other Ambulatory Visit (INDEPENDENT_AMBULATORY_CARE_PROVIDER_SITE_OTHER): Payer: Self-pay | Admitting: Vascular Surgery

## 2020-01-10 ENCOUNTER — Ambulatory Visit (INDEPENDENT_AMBULATORY_CARE_PROVIDER_SITE_OTHER): Payer: BC Managed Care – PPO

## 2020-01-10 ENCOUNTER — Other Ambulatory Visit: Payer: Self-pay

## 2020-01-10 ENCOUNTER — Telehealth (INDEPENDENT_AMBULATORY_CARE_PROVIDER_SITE_OTHER): Payer: Self-pay | Admitting: Vascular Surgery

## 2020-01-10 DIAGNOSIS — I8392 Asymptomatic varicose veins of left lower extremity: Secondary | ICD-10-CM | POA: Diagnosis not present

## 2020-01-10 NOTE — Telephone Encounter (Signed)
Can someone please call in a script for xanax for the next laser procedure please. Call not needed pt patient. Pharmacy will contact when ready. thanks

## 2020-01-10 NOTE — Telephone Encounter (Signed)
Rx has been called into pharmacy on patient chart

## 2020-01-21 ENCOUNTER — Ambulatory Visit (INDEPENDENT_AMBULATORY_CARE_PROVIDER_SITE_OTHER): Payer: BC Managed Care – PPO | Admitting: Vascular Surgery

## 2020-01-21 ENCOUNTER — Other Ambulatory Visit: Payer: Self-pay

## 2020-01-21 DIAGNOSIS — I83811 Varicose veins of right lower extremities with pain: Secondary | ICD-10-CM

## 2020-01-21 NOTE — Progress Notes (Signed)
Andrea Hudson is a 59 y.o. female who presents with symptomatic venous reflux  Past Medical History:  Diagnosis Date  . Chicken pox   . Depression   . Hypertension   . Hypothyroidism   . Obesity   . Rectocele     Past Surgical History:  Procedure Laterality Date  . ABCESS DRAINAGE    . ABDOMINAL HYSTERECTOMY    . COLONOSCOPY WITH PROPOFOL N/A 10/23/2015   Procedure: COLONOSCOPY WITH PROPOFOL;  Surgeon: Christena Deem, MD;  Location: [facility] ENDOSCOPY;  Service: Endoscopy;  Laterality: N/A;  . DG 2ND DIDGIT LEFT FOOT Left   . FRACTURE SURGERY    . NEUROPLASTY / TRANSPOSITION MEDIAN NERVE AT CARPAL TUNNEL Bilateral   . phlebectomy    . TUBAL LIGATION       Current Outpatient Medications:  .  cholecalciferol (VITAMIN D) 1000 units tablet, Take 1,000 Units by mouth daily., Disp: , Rfl:  .  citalopram (CELEXA) 10 MG tablet, Take 10 mg by mouth daily., Disp: , Rfl:  .  ibuprofen (ADVIL) 800 MG tablet, Take 1 tablet (800 mg total) by mouth every 8 (eight) hours as needed., Disp: 30 tablet, Rfl: 1 .  levothyroxine (SYNTHROID, LEVOTHROID) 75 MCG tablet, Take 75 mcg by mouth daily before breakfast., Disp: , Rfl:  .  loperamide (IMODIUM) 2 MG capsule, Take 2 mg by mouth as needed for diarrhea or loose stools., Disp: , Rfl:  .  montelukast (SINGULAIR) 10 MG tablet, Take 10 mg by mouth at bedtime., Disp: , Rfl:  .  Multiple Vitamin (MULTIVITAMIN WITH MINERALS) TABS tablet, Take 1 tablet by mouth daily., Disp: , Rfl:  .  Vitamin E 100 units TABS, Take by mouth., Disp: , Rfl:  .  meloxicam (MOBIC) 15 MG tablet, Take 1 tablet (15 mg total) by mouth daily as needed for pain. (Patient not taking: Reported on 10/20/2019), Disp: 30 tablet, Rfl: 0 .  ondansetron (ZOFRAN) 4 MG tablet, Take 1 tablet (4 mg total) by mouth every 8 (eight) hours as needed for nausea or vomiting. (Patient not taking: Reported on 10/20/2019), Disp: 20 tablet, Rfl: 0 .  tiZANidine (ZANAFLEX) 4 MG tablet, Take 1 tablet (4 mg  total) by mouth every 8 (eight) hours as needed for muscle spasms. (Patient not taking: Reported on 10/20/2019), Disp: 30 tablet, Rfl: 0  Allergies  Allergen Reactions  . Cough-Cold Medicine [Phenir-Pe-Sod Sal-Caff Cit] Other (See Comments)    Numbness of face  . Montelukast Other (See Comments)    Dryness of the mouth. Rx has become ineffective  . Other Other (See Comments)    OTC cold meds and cough meds Causes face numbness     Varicose veins of leg with pain, right     PLAN: The patient's right lower extremity was sterilely prepped and draped. The ultrasound machine was used to visualize the saphenous vein throughout its course. A segment in the distal thigh just above the knee was selected for access. The saphenous vein was accessed without difficulty using ultrasound guidance with a micropuncture needle. A 0.018 wire was then placed beyond the saphenofemoral junction and the needle was removed. The 65 cm sheath was then placed over the wire and the wire and dilator were removed. The laser fiber was then placed through the sheath and its tip was placed approximately 4-5 centimeters below the saphenofemoral junction. Tumescent anesthesia was then created with a dilute lidocaine solution. Laser energy was then delivered with constant withdrawal of the sheath and laser  fiber. Approximately 987 joules of energy were delivered over a length of 22 centimeters using a 1470 Hz VenaCure machine at 7 W. Sterile dressings were placed. The patient tolerated the procedure well without obvious complications.   Follow-up in 1 week with post-laser duplex.

## 2020-01-24 ENCOUNTER — Ambulatory Visit (INDEPENDENT_AMBULATORY_CARE_PROVIDER_SITE_OTHER): Payer: BC Managed Care – PPO

## 2020-01-24 ENCOUNTER — Other Ambulatory Visit: Payer: Self-pay

## 2020-01-24 DIAGNOSIS — I83811 Varicose veins of right lower extremities with pain: Secondary | ICD-10-CM | POA: Diagnosis not present

## 2020-02-03 DIAGNOSIS — R7309 Other abnormal glucose: Secondary | ICD-10-CM | POA: Insufficient documentation

## 2020-02-22 ENCOUNTER — Encounter (INDEPENDENT_AMBULATORY_CARE_PROVIDER_SITE_OTHER): Payer: Self-pay | Admitting: Vascular Surgery

## 2020-02-22 ENCOUNTER — Ambulatory Visit (INDEPENDENT_AMBULATORY_CARE_PROVIDER_SITE_OTHER): Payer: BC Managed Care – PPO | Admitting: Vascular Surgery

## 2020-02-22 ENCOUNTER — Other Ambulatory Visit: Payer: Self-pay

## 2020-02-22 DIAGNOSIS — I83813 Varicose veins of bilateral lower extremities with pain: Secondary | ICD-10-CM

## 2020-02-22 NOTE — Patient Instructions (Signed)
Escleroterapia Sclerotherapy  La escleroterapia es un procedimiento que se realiza para mejorar el aspecto de las vrices y las araas, as como para Engineer, materials, la inflamacin y los calambres en las piernas. Las vrices son venas que se han agrandado, abultado o torcido debido a una vlvula daada que causa que la sangre se acumule (acumulacin) en las venas. Las araas son vrices pequeas. En general, la escleroterapia es ms eficaz para las araas y vrices ms pequeas. En este procedimiento, se inyecta una sustancia qumica en una vena para cerrarla. Es posible que se necesite ms de un tratamiento para cerrar la vena en su totalidad. La escleroterapia suele realizarse en las piernas porque es donde aparecen con mayor frecuencia las vrices y araas. Informe al mdico acerca de lo siguiente:  Cualquier alergia que tenga.  Todos los [employer], incluidos vitaminas, hierbas, gotas oftlmicas, cremas y [address] de [address].  Cualquier enfermedad de la sangre que tenga.  Cirugas previas a las que se someti.  Cualquier enfermedad que tenga.  Si est embarazada o podra estarlo. Cules son los riesgos? En general, se trata de un procedimiento seguro. Sin embargo, pueden ocurrir complicaciones, por ejemplo:  Infeccin.  Hemorragia.  Reacciones alrgicas a los medicamentos o a los tintes.  Cogulos de [place].  Dao nervioso.  Hematomas y cicatrizacin.  Oscurecimiento de la piel alrededor de la zona afectada. Qu ocurre antes del procedimiento?  No se aplique ninguna locin ni crema en las piernas, a menos que el mdico se lo autorice.  Siga las indicaciones del mdico respecto de las restricciones para las comidas y las bebidas.  No consuma ningn producto que contenga nicotina o tabaco, como cigarrillos y Administrator, [employer]. Si necesita ayuda para dejar de fumar, consulte al mdico.  Consulte al mdico si debe hacer o no lo  siguiente: ? Multimedia programmer o suspender los medicamentos que toma habitualmente. Esto es muy importante si toma medicamentos para la diabetes o anticoagulantes. ? Tomar medicamentos como aspirina e ibuprofeno. Estos medicamentos pueden tener un efecto anticoagulante en la [place]. No tome estos medicamentos antes del procedimiento si su mdico le indica que no lo haga.  Es posible que le hagan una ecografa de la zona afectada para ver si hay cogulos de sangre y observar la circulacin de la [place].  En casos poco frecuentes, puede ser necesario un procedimiento radiogrfico para verificar cmo circula la sangre por las venas (angiograma). Para realizar el angiograma, se inyecta un tinte que permite ver las venas en las radiografas. Qu ocurre durante el procedimiento?  Para reducir el riesgo de infecciones: ? El equipo mdico se lavar o se desinfectar las manos. ? Le lavarn la piel con jabn. ? Pueden rasurarle la zona de tratamiento.  La sustancia qumica (esclerosante) se le inyectar en la vrice con Marella Bile pequea y delgada. El Programme researcher, broadcasting/film/video el revestimiento de la vena y har que la vena se cierre por debajo del sitio de la inyeccin. Es posible que tenga una sensacin de ardor, escozor o irritacin.  La inyeccin puede repetirse para ms de una vrice.  La zona donde le apliquen la inyeccin se envolver con vendas elsticas. Este procedimiento puede variar segn el mdico y el hospital. Ladell Heads sucede despus del procedimiento?  La zona donde le apliquen la inyeccin se envolver con vendas elsticas. Si hay sangrado, le cambiarn las vendas.  No conduzca hasta que el mdico lo autorice. Tal vez deba esperar 1o 2das antes de poder conducir.  Tendr que  usar medias de compresin durante una semana aproximadamente o el tiempo que el mdico le recomiende. Resumen  La escleroterapia es un procedimiento que se realiza para mejorar el aspecto de las vrices y las araas, as  como para aliviar el dolor, la inflamacin y los calambres en las piernas.  Se utiliza una aguja pequea y delgada para inyectar una sustancia qumica (esclerosante) en una vrice o araa para cerrarla.  Despus del procedimiento, la zona donde se aplicaron las inyecciones se envuelve con vendas elsticas. Esta informacin no tiene como fin reemplazar el consejo del mdico. Asegrese de hacerle al mdico cualquier pregunta que tenga. Document Revised: 03/12/2017 Document Reviewed: 03/12/2017 Elsevier Patient Education  2020 Elsevier Inc.  

## 2020-02-22 NOTE — Progress Notes (Signed)
MRN : [id number]  Andrea Hudson is a 59 y.o. (14-May-1961) female who presents with chief complaint of  Chief Complaint  Patient presents with  . Follow-up    4wk post laser follow up  .  History of Present Illness: Patient returns today in follow up of her venous disease.  She is status post laser ablation of both great saphenous veins which were successful without complicating features on duplex.  She has noticed a significant improvement in the pain and swelling in her legs.  She is still bothered by painful residual varicosities on both legs after the procedure.  No other new complaints today.  Current Outpatient Medications  Medication Sig Dispense Refill  . albuterol (VENTOLIN HFA) 108 (90 Base) MCG/ACT inhaler Inhale into the lungs.    . ALPRAZolam (XANAX) 0.25 MG tablet Take by mouth.    . cholecalciferol (VITAMIN D) 1000 units tablet Take 1,000 Units by mouth daily.    . citalopram (CELEXA) 10 MG tablet Take 10 mg by mouth daily.    Marland Kitchen ibuprofen (ADVIL) 800 MG tablet Take 1 tablet (800 mg total) by mouth every 8 (eight) hours as needed. 30 tablet 1  . levothyroxine (SYNTHROID, LEVOTHROID) 75 MCG tablet Take 75 mcg by mouth daily before breakfast.    . loperamide (IMODIUM) 2 MG capsule Take 2 mg by mouth as needed for diarrhea or loose stools.    . montelukast (SINGULAIR) 10 MG tablet Take 10 mg by mouth at bedtime.    . Multiple Vitamin (MULTIVITAMIN WITH MINERALS) TABS tablet Take 1 tablet by mouth daily.    . meloxicam (MOBIC) 15 MG tablet Take 1 tablet (15 mg total) by mouth daily as needed for pain. (Patient not taking: Reported on 10/20/2019) 30 tablet 0  . ondansetron (ZOFRAN) 4 MG tablet Take 1 tablet (4 mg total) by mouth every 8 (eight) hours as needed for nausea or vomiting. (Patient not taking: Reported on 10/20/2019) 20 tablet 0  . tiZANidine (ZANAFLEX) 4 MG tablet Take 1 tablet (4 mg total) by mouth every 8 (eight) hours as needed for muscle spasms. (Patient not taking:  Reported on 10/20/2019) 30 tablet 0  . Vitamin E 100 units TABS Take by mouth. (Patient not taking: Reported on 02/22/2020)     No current facility-administered medications for this visit.    Past Medical History:  Diagnosis Date  . Chicken pox   . Depression   . Hypertension   . Hypothyroidism   . Obesity   . Rectocele     Past Surgical History:  Procedure Laterality Date  . ABCESS DRAINAGE    . ABDOMINAL HYSTERECTOMY    . COLONOSCOPY WITH PROPOFOL N/A 10/23/2015   Procedure: COLONOSCOPY WITH PROPOFOL;  Surgeon: Christena Deem, MD;  Location: [facility] ENDOSCOPY;  Service: Endoscopy;  Laterality: N/A;  . DG 2ND DIDGIT LEFT FOOT Left   . FRACTURE SURGERY    . LASER ABLATION Bilateral   . NEUROPLASTY / TRANSPOSITION MEDIAN NERVE AT CARPAL TUNNEL Bilateral   . phlebectomy    . TUBAL LIGATION       Social History   Tobacco Use  . Smoking status: Never Smoker  . Smokeless tobacco: Never Used  Substance Use Topics  . Alcohol use: No  . Drug use: No      Family History  Problem Relation Age of Onset  . Breast cancer Neg Hx      Allergies  Allergen Reactions  . Cough-Cold Medicine [Phenir-Pe-Sod Sal-Caff Cit]  Other (See Comments)    Numbness of face  . Montelukast Other (See Comments)    Dryness of the mouth. Rx has become ineffective  . Other Other (See Comments)    OTC cold meds and cough meds Causes face numbness     REVIEW OF SYSTEMS (Negative unless checked)  Constitutional: [] Weight loss  [] Fever  [] Chills Cardiac: [] Chest pain   [] Chest pressure   [] Palpitations   [] Shortness of breath when laying flat   [] Shortness of breath at rest   [] Shortness of breath with exertion. Vascular:  [] Pain in legs with walking   [] Pain in legs at rest   [] Pain in legs when laying flat   [] Claudication   [] Pain in feet when walking  [] Pain in feet at rest  [] Pain in feet when laying flat   [] History of DVT   [] Phlebitis   [x] Swelling in legs   [x] Varicose veins    [] Non-healing ulcers Pulmonary:   [] Uses home oxygen   [] Productive cough   [] Hemoptysis   [] Wheeze  [] COPD   [] Asthma Neurologic:  [] Dizziness  [] Blackouts   [] Seizures   [] History of stroke   [] History of TIA  [] Aphasia   [] Temporary blindness   [] Dysphagia   [] Weakness or numbness in arms   [] Weakness or numbness in legs Musculoskeletal:  [] Arthritis   [] Joint swelling   [] Joint pain   [] Low back pain Hematologic:  [] Easy bruising  [] Easy bleeding   [] Hypercoagulable state   [] Anemic   Gastrointestinal:  [] Blood in stool   [] Vomiting blood  [] Gastroesophageal reflux/heartburn   [] Abdominal pain Genitourinary:  [] Chronic kidney disease   [] Difficult urination  [] Frequent urination  [] Burning with urination   [] Hematuria Skin:  [] Rashes   [] Ulcers   [] Wounds Psychological:  [] History of anxiety   []  History of major depression.  Physical Examination  BP (!) 146/82 (BP Location: Right Arm)   Pulse 79   Resp 16   Wt 172 lb (78 kg)   BMI 33.59 kg/m  Gen:  WD/WN, NAD Head: [place]/AT, No temporalis wasting. Ear/Nose/Throat: Hearing grossly intact, nares w/o erythema or drainage Eyes: Conjunctiva clear. Sclera non-icteric Neck: Supple.  Trachea midline Pulmonary:  Good air movement, no use of accessory muscles.  Cardiac: RRR, no JVD Vascular: Diffuse varicosities are present throughout both lower extremities measuring 1 to 2 mm in diameter at their largest. Vessel Right Left  Radial Palpable Palpable                          PT Palpable Palpable  DP Palpable Palpable   Gastrointestinal: soft, non-tender/non-distended. No guarding/reflex.  Musculoskeletal: M/S 5/5 throughout.  No deformity or atrophy.  No significant bilateral lower extremity edema. Neurologic: Sensation grossly intact in extremities.  Symmetrical.  Speech is fluent.  Psychiatric: Judgment intact, Mood & affect appropriate for pt's clinical situation. Dermatologic: No rashes or ulcers noted.  No cellulitis or open  wounds.       Labs No results found for this or any previous visit (from the past 2160 hour(s)).  Radiology VAS LASER ABLATION OF SUPERFICIAL VEI  Result Date: 01/25/2020  Lower Venous Study Indications: Imaging Post Rt GSV Ablation.  Performing Technologist: RVS  Examination Guidelines: A complete evaluation includes B-mode imaging, spectral Doppler, color Doppler, and power Doppler as needed of all accessible portions of each vessel. Bilateral testing is considered an integral part of a complete examination. Limited examinations for reoccurring indications may be performed as  noted.  +---------+---------------+---------+-----------+----------+--------------+ RIGHT    CompressibilityPhasicitySpontaneityPropertiesThrombus Aging +---------+---------------+---------+-----------+----------+--------------+ CFV      Full           Yes      Yes                                 +---------+---------------+---------+-----------+----------+--------------+ SFJ      Full           Yes      Yes                                 +---------+---------------+---------+-----------+----------+--------------+ FV Prox  Full           Yes      Yes                                 +---------+---------------+---------+-----------+----------+--------------+ FV Mid   Full           Yes      Yes                                 +---------+---------------+---------+-----------+----------+--------------+ FV DistalFull           Yes      Yes                                 +---------+---------------+---------+-----------+----------+--------------+ PFV      Full           Yes      Yes                                 +---------+---------------+---------+-----------+----------+--------------+ POP      Full           Yes      Yes                                 +---------+---------------+---------+-----------+----------+--------------+     Summary: Right: No reflux  was noted in the common femoral vein, femoral vein in the thigh, popliteal vein, and great saphenous vein at the saphenofemoral junction. Post Ablation Imaging performed; No flow seen in the Rt GSV knee level to approximately 2cms from the SFJ.  *See table(s) above for measurements and observations. Electronically signed by Festus Barren MD on 01/25/2020 at 10:44:38 AM.    Final     Assessment/Plan  Varicose veins of leg with pain, bilateral Recommend:  The patient has had successful ablation of the previously incompetent saphenous venous system but still has persistent symptoms of pain and swelling that are having a negative impact on daily life and daily activities.  Patient should undergo injection sclerotherapy to treat the residual varicosities.  The risks, benefits and alternative therapies were reviewed in detail with the patient.  All questions were answered.  The patient agrees to proceed with sclerotherapy at their convenience.  The patient will continue wearing the graduated compression stockings and using the over-the-counter pain medications to treat her symptoms.         Festus Barren, MD  02/22/2020 11:58 AM  This note was created with Dragon medical transcription system.  Any errors from dictation are purely unintentional

## 2020-02-22 NOTE — Assessment & Plan Note (Signed)
Recommend:  The patient has had successful ablation of the previously incompetent saphenous venous system but still has persistent symptoms of pain and swelling that are having a negative impact on daily life and daily activities.  Patient should undergo injection sclerotherapy to treat the residual varicosities.  The risks, benefits and alternative therapies were reviewed in detail with the patient.  All questions were answered.  The patient agrees to proceed with sclerotherapy at their convenience.  The patient will continue wearing the graduated compression stockings and using the over-the-counter pain medications to treat her symptoms.     

## 2020-03-15 ENCOUNTER — Ambulatory Visit (INDEPENDENT_AMBULATORY_CARE_PROVIDER_SITE_OTHER): Payer: BC Managed Care – PPO | Admitting: Vascular Surgery

## 2020-03-15 ENCOUNTER — Other Ambulatory Visit: Payer: Self-pay

## 2020-03-15 ENCOUNTER — Encounter (INDEPENDENT_AMBULATORY_CARE_PROVIDER_SITE_OTHER): Payer: Self-pay | Admitting: Vascular Surgery

## 2020-03-15 VITALS — BP 125/83 | HR 81 | Ht 60.0 in | Wt 174.0 lb

## 2020-03-15 DIAGNOSIS — I83813 Varicose veins of bilateral lower extremities with pain: Secondary | ICD-10-CM | POA: Diagnosis not present

## 2020-03-15 NOTE — Progress Notes (Signed)
Varicose veins of bilateral  lower extremity with inflammation (454.1  I83.10) Current Plans   Indication: Patient presents with symptomatic varicose veins of the bilateral  lower extremity.   Procedure: Sclerotherapy using hypertonic saline mixed with 1% Lidocaine was performed on the bilateral lower extremity. Compression wraps were placed. The patient tolerated the procedure well. 

## 2020-04-12 ENCOUNTER — Encounter (INDEPENDENT_AMBULATORY_CARE_PROVIDER_SITE_OTHER): Payer: Self-pay | Admitting: Vascular Surgery

## 2020-04-12 ENCOUNTER — Other Ambulatory Visit: Payer: Self-pay

## 2020-04-12 ENCOUNTER — Ambulatory Visit (INDEPENDENT_AMBULATORY_CARE_PROVIDER_SITE_OTHER): Payer: BC Managed Care – PPO | Admitting: Vascular Surgery

## 2020-04-12 VITALS — BP 154/93 | HR 88 | Resp 16 | Wt 170.0 lb

## 2020-04-12 DIAGNOSIS — I83813 Varicose veins of bilateral lower extremities with pain: Secondary | ICD-10-CM

## 2020-04-12 NOTE — Progress Notes (Signed)
Varicose veins of bilateral  lower extremity with inflammation (454.1  I83.10) Current Plans   Indication: Patient presents with symptomatic varicose veins of the bilateral  lower extremity.   Procedure: Sclerotherapy using hypertonic saline mixed with 1% Lidocaine was performed on the bilateral lower extremity. Compression wraps were placed. The patient tolerated the procedure well. 

## 2020-05-10 ENCOUNTER — Encounter (INDEPENDENT_AMBULATORY_CARE_PROVIDER_SITE_OTHER): Payer: Self-pay | Admitting: Vascular Surgery

## 2020-05-10 ENCOUNTER — Other Ambulatory Visit: Payer: Self-pay

## 2020-05-10 ENCOUNTER — Ambulatory Visit (INDEPENDENT_AMBULATORY_CARE_PROVIDER_SITE_OTHER): Payer: BC Managed Care – PPO | Admitting: Vascular Surgery

## 2020-05-10 VITALS — BP 123/80 | HR 86 | Ht 60.0 in | Wt 168.0 lb

## 2020-05-10 DIAGNOSIS — I83813 Varicose veins of bilateral lower extremities with pain: Secondary | ICD-10-CM

## 2020-05-10 NOTE — Progress Notes (Signed)
Varicose veins of bilateral lower extremity with inflammation (454.1  I83.10) Current Plans   Indication: Patient presents with symptomatic varicose veins of the bilateral lower extremity.   Procedure: Sclerotherapy using hypertonic saline mixed with 1% Lidocaine was performed on the bilateral lower extremity. Compression wraps were placed. The patient tolerated the procedure well.  Of note: Patient has been through multiple saline sclerotherapy's and states minimal improvement in the discomfort located to the bilateral legs.  She notes new formation of spider veins to the bilateral thighs.  The patient is being compliant in engaging in conservative therapy including wearing her medical grade 1 compression socks, elevating her legs and remaining active.  Recommend: Undergoing bilateral venous duplex to ensure her previously lasered veins have not recannulated.  Also recommend undergoing foam sclerotherapy to the larger varicosities located to the bilateral legs.

## 2020-05-16 ENCOUNTER — Other Ambulatory Visit (INDEPENDENT_AMBULATORY_CARE_PROVIDER_SITE_OTHER): Payer: Self-pay | Admitting: Vascular Surgery

## 2020-05-16 DIAGNOSIS — I83813 Varicose veins of bilateral lower extremities with pain: Secondary | ICD-10-CM

## 2020-05-17 ENCOUNTER — Ambulatory Visit (INDEPENDENT_AMBULATORY_CARE_PROVIDER_SITE_OTHER): Payer: BC Managed Care – PPO

## 2020-05-17 ENCOUNTER — Ambulatory Visit (INDEPENDENT_AMBULATORY_CARE_PROVIDER_SITE_OTHER): Payer: BC Managed Care – PPO | Admitting: Nurse Practitioner

## 2020-05-17 ENCOUNTER — Other Ambulatory Visit: Payer: Self-pay

## 2020-05-17 ENCOUNTER — Encounter (INDEPENDENT_AMBULATORY_CARE_PROVIDER_SITE_OTHER): Payer: Self-pay | Admitting: Nurse Practitioner

## 2020-05-17 VITALS — BP 113/76 | HR 85 | Resp 16 | Wt 170.0 lb

## 2020-05-17 DIAGNOSIS — I83813 Varicose veins of bilateral lower extremities with pain: Secondary | ICD-10-CM

## 2020-05-17 DIAGNOSIS — J4520 Mild intermittent asthma, uncomplicated: Secondary | ICD-10-CM

## 2020-05-17 DIAGNOSIS — E781 Pure hyperglyceridemia: Secondary | ICD-10-CM

## 2020-05-18 ENCOUNTER — Ambulatory Visit (INDEPENDENT_AMBULATORY_CARE_PROVIDER_SITE_OTHER): Payer: BC Managed Care – PPO | Admitting: Nurse Practitioner

## 2020-05-21 ENCOUNTER — Encounter (INDEPENDENT_AMBULATORY_CARE_PROVIDER_SITE_OTHER): Payer: Self-pay | Admitting: Nurse Practitioner

## 2020-05-21 NOTE — Progress Notes (Signed)
Subjective:    Patient ID: Andrea Hudson, female    DOB: 10-Mar-1961, 59 y.o.   MRN: [id number] Chief Complaint  Patient presents with  . Follow-up    add on ultrasound follow up    The patient is seen for evaluation of symptomatic varicose veins. The patient relates burning and stinging which worsened steadily throughout the course of the day, particularly with standing. The patient also notes an aching and throbbing pain over the varicosities, particularly with prolonged dependent positions. The symptoms are significantly improved with elevation.  The patient also notes that during hot weather the symptoms are greatly intensified. The patient states the pain from the varicose veins interferes with work, daily exercise, shopping and household maintenance. At this point, the symptoms are persistent and severe enough that they're having a negative impact on lifestyle and are interfering with daily activities.  There is no history of DVT, PE or superficial thrombophlebitis. There is no history of ulceration or hemorrhage. The patient denies a significant family history of varicose veins.   The patient had a previous endovenous laser ablation of her bilateral great saphenous veins.  This was done around July 2021.  The patient's post ablation ultrasound show successful vein ablations.  The patient has continued to wear medical grade 1 compression stockings since that time.  However, today it is noted that the patient has reflux from the great saphenous vein at the saphenofemoral junction down to the proximal calf of the right lower extremity.  The patient has reflux in her left great saphenous vein at the saphenofemoral junction proximal thigh.  The patient has no evidence of DVT or superficial venous thrombosis seen bilaterally.     Review of Systems  Cardiovascular: Positive for leg swelling.  Hematological: Bruises/bleeds easily.  All other systems reviewed and are negative.        Objective:   Physical Exam Vitals reviewed.  HENT:     Head: Normocephalic.  Cardiovascular:     Rate and Rhythm: Normal rate.     Pulses: Normal pulses.  Pulmonary:     Effort: Pulmonary effort is normal.  Musculoskeletal:        General: Normal range of motion.     Right lower leg: Edema present.     Left lower leg: Edema present.  Skin:    General: Skin is warm and dry.  Neurological:     Mental Status: She is alert and oriented to person, place, and time.  Psychiatric:        Mood and Affect: Mood normal.        Behavior: Behavior normal.        Thought Content: Thought content normal.        Judgment: Judgment normal.     BP 113/76 (BP Location: Right Arm)   Pulse 85   Resp 16   Wt 170 lb (77.1 kg)   BMI 33.20 kg/m   Past Medical History:  Diagnosis Date  . Chicken pox   . Depression   . Hypertension   . Hypothyroidism   . Obesity   . Rectocele     Social History   Socioeconomic History  . Marital status: Married    Spouse name: Not on file  . Number of children: Not on file  . Years of education: Not on file  . Highest education level: Not on file  Occupational History  . Not on file  Tobacco Use  . Smoking status: Never Smoker  . Smokeless  tobacco: Never Used  Substance and Sexual Activity  . Alcohol use: No  . Drug use: No  . Sexual activity: Not on file  Other Topics Concern  . Not on file  Social History Narrative  . Not on file   Social Determinants of Health   Financial Resource Strain:   . Difficulty of Paying Living Expenses: Not on file  Food Insecurity:   . Worried About Programme researcher, broadcasting/film/video in the Last Year: Not on file  . Ran Out of Food in the Last Year: Not on file  Transportation Needs:   . Lack of Transportation (Medical): Not on file  . Lack of Transportation (Non-Medical): Not on file  Physical Activity:   . Days of Exercise per Week: Not on file  . Minutes of Exercise per Session: Not on file  Stress:   . Feeling  of Stress : Not on file  Social Connections:   . Frequency of Communication with Friends and Family: Not on file  . Frequency of Social Gatherings with Friends and Family: Not on file  . Attends Religious Services: Not on file  . Active Member of Clubs or Organizations: Not on file  . Attends Banker Meetings: Not on file  . Marital Status: Not on file  Intimate Partner Violence:   . Fear of Current or Ex-Partner: Not on file  . Emotionally Abused: Not on file  . Physically Abused: Not on file  . Sexually Abused: Not on file    Past Surgical History:  Procedure Laterality Date  . ABCESS DRAINAGE    . ABDOMINAL HYSTERECTOMY    . COLONOSCOPY WITH PROPOFOL N/A 10/23/2015   Procedure: COLONOSCOPY WITH PROPOFOL;  Surgeon: Christena Deem, MD;  Location: [facility] ENDOSCOPY;  Service: Endoscopy;  Laterality: N/A;  . DG 2ND DIDGIT LEFT FOOT Left   . FRACTURE SURGERY    . LASER ABLATION Bilateral   . NEUROPLASTY / TRANSPOSITION MEDIAN NERVE AT CARPAL TUNNEL Bilateral   . phlebectomy    . TUBAL LIGATION      Family History  Problem Relation Age of Onset  . Breast cancer Neg Hx     Allergies  Allergen Reactions  . Cough-Cold Medicine [Phenir-Pe-Sod Sal-Caff Cit] Other (See Comments)    Numbness of face  . Montelukast Other (See Comments)    Dryness of the mouth. Rx has become ineffective  . Other Other (See Comments)    OTC cold meds and cough meds Causes face numbness    CBC Latest Ref Rng & Units 03/13/2016  WBC 3.6 - 11.0 K/uL 6.1  Hemoglobin 12.0 - 16.0 g/dL 40.9  Hematocrit 35 - 47 % 40.2  Platelets 150 - 440 K/uL 259      CMP     Component Value Date/Time   NA 137 03/13/2016 1155   K 4.0 03/13/2016 1155   CL 104 03/13/2016 1155   CO2 25 03/13/2016 1155   GLUCOSE 117 (H) 03/13/2016 1155   BUN 12 03/13/2016 1155   CREATININE 0.61 03/13/2016 1155   CALCIUM 9.0 03/13/2016 1155   PROT 7.8 03/13/2016 1155   ALBUMIN 4.3 03/13/2016 1155   AST 64 (H)  03/13/2016 1155   ALT 66 (H) 03/13/2016 1155   ALKPHOS 76 03/13/2016 1155   BILITOT 0.2 (L) 03/13/2016 1155   GFRNONAA >60 03/13/2016 1155   GFRAA >60 03/13/2016 1155     No results found.     Assessment & Plan:   1. Varicose veins of  leg with pain, bilateral Recommend  I have reviewed my previous  discussion with the patient regarding  varicose veins and why they cause symptoms. Patient will continue  wearing graduated compression stockings class 1 on a daily basis, beginning first thing in the morning and removing them in the evening.    In addition, behavioral modification including elevation during the day was again discussed and this will continue.  The patient has utilized over the counter pain medications and has been exercising.  However, at this time conservative therapy has not alleviated the patient's symptoms of leg pain and swelling  Recommend: laser ablation of the right great saphenous veins to eliminate the symptoms of pain and swelling of the lower extremities caused by the severe superficial venous reflux disease.   2. Hypertriglyceridemia Continue statin as ordered and reviewed, no changes at this time   3. Mild intermittent asthma without complication Continue pulmonary medications and aerosols as already ordered, these medications have been reviewed and there are no changes at this time.     Current Outpatient Medications on File Prior to Visit  Medication Sig Dispense Refill  . cholecalciferol (VITAMIN D) 1000 units tablet Take 1,000 Units by mouth daily.     Marland Kitchen ibuprofen (ADVIL) 800 MG tablet Take 1 tablet (800 mg total) by mouth every 8 (eight) hours as needed. 30 tablet 1  . levothyroxine (SYNTHROID, LEVOTHROID) 75 MCG tablet Take 75 mcg by mouth daily before breakfast.    . Multiple Vitamin (MULTIVITAMIN WITH MINERALS) TABS tablet Take 1 tablet by mouth daily.    . Vitamin E 100 units TABS Take by mouth.     Marland Kitchen albuterol (VENTOLIN HFA) 108 (90 Base)  MCG/ACT inhaler Inhale into the lungs. (Patient not taking: Reported on 05/17/2020)    . cetirizine (ZYRTEC) 10 MG tablet Take 10 mg by mouth daily. (Patient not taking: Reported on 05/17/2020)    . citalopram (CELEXA) 10 MG tablet Take 10 mg by mouth daily.  (Patient not taking: Reported on 05/17/2020)    . loperamide (IMODIUM) 2 MG capsule Take 2 mg by mouth as needed for diarrhea or loose stools.  (Patient not taking: Reported on 05/17/2020)    . meloxicam (MOBIC) 15 MG tablet Take 1 tablet (15 mg total) by mouth daily as needed for pain. (Patient not taking: Reported on 05/17/2020) 30 tablet 0  . montelukast (SINGULAIR) 10 MG tablet Take 10 mg by mouth at bedtime.  (Patient not taking: Reported on 05/17/2020)    . ondansetron (ZOFRAN) 4 MG tablet Take 1 tablet (4 mg total) by mouth every 8 (eight) hours as needed for nausea or vomiting. (Patient not taking: Reported on 05/17/2020) 20 tablet 0  . tiZANidine (ZANAFLEX) 4 MG tablet Take 1 tablet (4 mg total) by mouth every 8 (eight) hours as needed for muscle spasms. (Patient not taking: Reported on 05/17/2020) 30 tablet 0   No current facility-administered medications on file prior to visit.    There are no Patient Instructions on file for this visit. No follow-ups on file.   Georgiana Spinner, NP

## 2020-06-14 ENCOUNTER — Ambulatory Visit (INDEPENDENT_AMBULATORY_CARE_PROVIDER_SITE_OTHER): Payer: BC Managed Care – PPO | Admitting: Vascular Surgery

## 2020-07-04 ENCOUNTER — Ambulatory Visit (INDEPENDENT_AMBULATORY_CARE_PROVIDER_SITE_OTHER): Payer: BC Managed Care – PPO | Admitting: Vascular Surgery

## 2020-08-08 ENCOUNTER — Encounter (INDEPENDENT_AMBULATORY_CARE_PROVIDER_SITE_OTHER): Payer: Self-pay | Admitting: Vascular Surgery

## 2020-08-08 ENCOUNTER — Other Ambulatory Visit: Payer: Self-pay

## 2020-08-08 ENCOUNTER — Ambulatory Visit (INDEPENDENT_AMBULATORY_CARE_PROVIDER_SITE_OTHER): Payer: BC Managed Care – PPO | Admitting: Vascular Surgery

## 2020-08-08 VITALS — BP 127/83 | HR 92 | Resp 16 | Wt 169.0 lb

## 2020-08-08 DIAGNOSIS — I83813 Varicose veins of bilateral lower extremities with pain: Secondary | ICD-10-CM | POA: Diagnosis not present

## 2020-08-08 NOTE — Progress Notes (Signed)
Andrea Hudson is a 60 y.o.female who presents with painful varicose veins of the right leg  Past Medical History:  Diagnosis Date  . Chicken pox   . Depression   . Hypertension   . Hypothyroidism   . Obesity   . Rectocele     Past Surgical History:  Procedure Laterality Date  . ABCESS DRAINAGE    . ABDOMINAL HYSTERECTOMY    . COLONOSCOPY WITH PROPOFOL N/A 10/23/2015   Procedure: COLONOSCOPY WITH PROPOFOL;  Surgeon: Christena Deem, MD;  Location: [facility] ENDOSCOPY;  Service: Endoscopy;  Laterality: N/A;  . DG 2ND DIDGIT LEFT FOOT Left   . FRACTURE SURGERY    . LASER ABLATION Bilateral   . NEUROPLASTY / TRANSPOSITION MEDIAN NERVE AT CARPAL TUNNEL Bilateral   . phlebectomy    . TUBAL LIGATION      Current Outpatient Medications  Medication Sig Dispense Refill  . cholecalciferol (VITAMIN D) 1000 units tablet Take 1,000 Units by mouth daily.     Marland Kitchen ibuprofen (ADVIL) 800 MG tablet Take 1 tablet (800 mg total) by mouth every 8 (eight) hours as needed. 30 tablet 1  . levothyroxine (SYNTHROID, LEVOTHROID) 75 MCG tablet Take 75 mcg by mouth daily before breakfast.    . Multiple Vitamin (MULTIVITAMIN WITH MINERALS) TABS tablet Take 1 tablet by mouth daily.    . Vitamin E 100 units TABS Take by mouth.     Marland Kitchen albuterol (VENTOLIN HFA) 108 (90 Base) MCG/ACT inhaler Inhale into the lungs. (Patient not taking: Reported on 05/17/2020)    . cetirizine (ZYRTEC) 10 MG tablet Take 10 mg by mouth daily. (Patient not taking: No sig reported)    . citalopram (CELEXA) 10 MG tablet Take 10 mg by mouth daily.  (Patient not taking: No sig reported)    . loperamide (IMODIUM) 2 MG capsule Take 2 mg by mouth as needed for diarrhea or loose stools.  (Patient not taking: No sig reported)    . meloxicam (MOBIC) 15 MG tablet Take 1 tablet (15 mg total) by mouth daily as needed for pain. (Patient not taking: No sig reported) 30 tablet 0  . montelukast (SINGULAIR) 10 MG tablet Take 10 mg by mouth at bedtime.  (Patient  not taking: No sig reported)    . ondansetron (ZOFRAN) 4 MG tablet Take 1 tablet (4 mg total) by mouth every 8 (eight) hours as needed for nausea or vomiting. (Patient not taking: No sig reported) 20 tablet 0  . tiZANidine (ZANAFLEX) 4 MG tablet Take 1 tablet (4 mg total) by mouth every 8 (eight) hours as needed for muscle spasms. (Patient not taking: No sig reported) 30 tablet 0   No current facility-administered medications for this visit.    Allergies  Allergen Reactions  . Aches-Pains Medicine [Traumeel]     Other reaction(s): Unknown  . Cough-Cold Medicine [Phenir-Pe-Sod Sal-Caff Cit] Other (See Comments)    Numbness of face  . Montelukast Other (See Comments)    Dryness of the mouth. Rx has become ineffective  . Other Other (See Comments)    OTC cold meds and cough meds Causes face numbness    Indication: Patient presents with symptomatic varicose veins of the right lower extremity.  Procedure: Foam sclerotherapy was performed on the right lower extremity. Using ultrasound guidance, 5 mL of foam Sotradecol was used to inject the varicosities of the right lower extremity. Compression wraps were placed. The patient tolerated the procedure well.

## 2020-08-28 ENCOUNTER — Other Ambulatory Visit: Payer: Self-pay | Admitting: Internal Medicine

## 2020-08-28 DIAGNOSIS — Z1231 Encounter for screening mammogram for malignant neoplasm of breast: Secondary | ICD-10-CM

## 2020-09-05 ENCOUNTER — Ambulatory Visit (INDEPENDENT_AMBULATORY_CARE_PROVIDER_SITE_OTHER): Payer: BC Managed Care – PPO | Admitting: Vascular Surgery

## 2020-09-05 ENCOUNTER — Other Ambulatory Visit: Payer: Self-pay

## 2020-09-05 VITALS — BP 128/87 | HR 87 | Ht 60.0 in | Wt 170.0 lb

## 2020-09-05 DIAGNOSIS — I83813 Varicose veins of bilateral lower extremities with pain: Secondary | ICD-10-CM | POA: Diagnosis not present

## 2020-09-05 DIAGNOSIS — M4310 Spondylolisthesis, site unspecified: Secondary | ICD-10-CM | POA: Insufficient documentation

## 2020-09-05 DIAGNOSIS — M4302 Spondylolysis, cervical region: Secondary | ICD-10-CM | POA: Insufficient documentation

## 2020-09-05 NOTE — Progress Notes (Signed)
Andrea Hudson is a 60 y.o.female who presents with painful varicose veins of the left leg  Past Medical History:  Diagnosis Date  . Chicken pox   . Depression   . Hypertension   . Hypothyroidism   . Obesity   . Rectocele     Past Surgical History:  Procedure Laterality Date  . ABCESS DRAINAGE    . ABDOMINAL HYSTERECTOMY    . COLONOSCOPY WITH PROPOFOL N/A 10/23/2015   Procedure: COLONOSCOPY WITH PROPOFOL;  Surgeon: Christena Deem, MD;  Location: [facility] ENDOSCOPY;  Service: Endoscopy;  Laterality: N/A;  . DG 2ND DIDGIT LEFT FOOT Left   . FRACTURE SURGERY    . LASER ABLATION Bilateral   . NEUROPLASTY / TRANSPOSITION MEDIAN NERVE AT CARPAL TUNNEL Bilateral   . phlebectomy    . TUBAL LIGATION      Current Outpatient Medications  Medication Sig Dispense Refill  . albuterol (VENTOLIN HFA) 108 (90 Base) MCG/ACT inhaler Inhale into the lungs. (Patient not taking: Reported on 05/17/2020)    . cetirizine (ZYRTEC) 10 MG tablet Take 10 mg by mouth daily. (Patient not taking: No sig reported)    . cholecalciferol (VITAMIN D) 1000 units tablet Take 1,000 Units by mouth daily.     . citalopram (CELEXA) 10 MG tablet Take 10 mg by mouth daily.  (Patient not taking: No sig reported)    . ibuprofen (ADVIL) 400 MG tablet     . ibuprofen (ADVIL) 800 MG tablet Take 1 tablet (800 mg total) by mouth every 8 (eight) hours as needed. (Patient not taking: Reported on 09/05/2020) 30 tablet 1  . levothyroxine (SYNTHROID, LEVOTHROID) 75 MCG tablet Take 75 mcg by mouth daily before breakfast.    . loperamide (IMODIUM) 2 MG capsule Take 2 mg by mouth as needed for diarrhea or loose stools.  (Patient not taking: No sig reported)    . meloxicam (MOBIC) 15 MG tablet Take 1 tablet (15 mg total) by mouth daily as needed for pain. (Patient not taking: No sig reported) 30 tablet 0  . montelukast (SINGULAIR) 10 MG tablet Take 10 mg by mouth at bedtime.  (Patient not taking: No sig reported)    . Multiple Vitamin  (MULTIVITAMIN WITH MINERALS) TABS tablet Take 1 tablet by mouth daily. (Patient not taking: Reported on 09/05/2020)    . ondansetron (ZOFRAN) 4 MG tablet Take 1 tablet (4 mg total) by mouth every 8 (eight) hours as needed for nausea or vomiting. (Patient not taking: No sig reported) 20 tablet 0  . tiZANidine (ZANAFLEX) 4 MG tablet Take 1 tablet (4 mg total) by mouth every 8 (eight) hours as needed for muscle spasms. (Patient not taking: No sig reported) 30 tablet 0  . Vitamin E 100 units TABS Take by mouth.      No current facility-administered medications for this visit.    Allergies  Allergen Reactions  . Aches-Pains Medicine [Traumeel]     Other reaction(s): Unknown  . Cough-Cold Medicine [Phenir-Pe-Sod Sal-Caff Cit] Other (See Comments)    Numbness of face  . Montelukast Other (See Comments)    Dryness of the mouth. Rx has become ineffective  . Other Other (See Comments)    OTC cold meds and cough meds Causes face numbness    Indication: Patient presents with symptomatic varicose veins of the left lower extremity.  Procedure: Foam sclerotherapy was performed on the left lower extremity. Using ultrasound guidance, 5 mL of foam Sotradecol was used to inject the varicosities of the  left lower extremity. Compression wraps were placed. The patient tolerated the procedure well.

## 2020-09-15 ENCOUNTER — Other Ambulatory Visit: Payer: Self-pay

## 2020-09-15 ENCOUNTER — Ambulatory Visit
Admission: RE | Admit: 2020-09-15 | Discharge: 2020-09-15 | Disposition: A | Discharge status: Home / Self Care | Payer: BC Managed Care – PPO | Source: Ambulatory Visit | Attending: Internal Medicine | Admitting: Internal Medicine

## 2020-09-15 DIAGNOSIS — Z1231 Encounter for screening mammogram for malignant neoplasm of breast: Secondary | ICD-10-CM | POA: Insufficient documentation

## 2020-10-06 ENCOUNTER — Ambulatory Visit (INDEPENDENT_AMBULATORY_CARE_PROVIDER_SITE_OTHER): Payer: BC Managed Care – PPO | Admitting: Vascular Surgery

## 2020-10-27 ENCOUNTER — Ambulatory Visit (INDEPENDENT_AMBULATORY_CARE_PROVIDER_SITE_OTHER): Payer: BC Managed Care – PPO | Admitting: Vascular Surgery

## 2020-10-27 ENCOUNTER — Encounter (INDEPENDENT_AMBULATORY_CARE_PROVIDER_SITE_OTHER): Payer: Self-pay | Admitting: Vascular Surgery

## 2020-10-27 ENCOUNTER — Other Ambulatory Visit: Payer: Self-pay

## 2020-10-27 VITALS — BP 118/79 | HR 81 | Ht 60.0 in | Wt 173.0 lb

## 2020-10-27 DIAGNOSIS — I83811 Varicose veins of right lower extremities with pain: Secondary | ICD-10-CM

## 2020-10-27 NOTE — Progress Notes (Signed)
DAMEKA YOUNKER is a 60 y.o. female who presents with symptomatic venous reflux  Past Medical History:  Diagnosis Date  . Chicken pox   . Depression   . Hypertension   . Hypothyroidism   . Obesity   . Rectocele     Past Surgical History:  Procedure Laterality Date  . ABCESS DRAINAGE    . ABDOMINAL HYSTERECTOMY    . COLONOSCOPY WITH PROPOFOL N/A 10/23/2015   Procedure: COLONOSCOPY WITH PROPOFOL;  Surgeon: Christena Deem, MD;  Location: [facility] ENDOSCOPY;  Service: Endoscopy;  Laterality: N/A;  . DG 2ND DIDGIT LEFT FOOT Left   . FRACTURE SURGERY    . LASER ABLATION Bilateral   . NEUROPLASTY / TRANSPOSITION MEDIAN NERVE AT CARPAL TUNNEL Bilateral   . phlebectomy    . TUBAL LIGATION       Current Outpatient Medications:  .  ALPRAZolam (XANAX) 0.5 MG tablet, Take by mouth., Disp: , Rfl:  .  cetirizine (ZYRTEC) 10 MG tablet, Take 10 mg by mouth daily., Disp: , Rfl:  .  cholecalciferol (VITAMIN D) 1000 units tablet, Take 1,000 Units by mouth daily. , Disp: , Rfl:  .  citalopram (CELEXA) 10 MG tablet, Take 10 mg by mouth daily., Disp: , Rfl:  .  ibuprofen (ADVIL) 400 MG tablet, , Disp: , Rfl:  .  ibuprofen (ADVIL) 800 MG tablet, Take 1 tablet (800 mg total) by mouth every 8 (eight) hours as needed., Disp: 30 tablet, Rfl: 1 .  levothyroxine (SYNTHROID) 88 MCG tablet, Take 88 mcg by mouth every morning., Disp: , Rfl:  .  levothyroxine (SYNTHROID, LEVOTHROID) 75 MCG tablet, Take 75 mcg by mouth daily before breakfast., Disp: , Rfl:  .  loperamide (IMODIUM) 2 MG capsule, Take 2 mg by mouth as needed for diarrhea or loose stools., Disp: , Rfl:  .  meloxicam (MOBIC) 15 MG tablet, Take 1 tablet (15 mg total) by mouth daily as needed for pain., Disp: 30 tablet, Rfl: 0 .  montelukast (SINGULAIR) 10 MG tablet, Take 10 mg by mouth at bedtime., Disp: , Rfl:  .  Multiple Vitamin (MULTIVITAMIN WITH MINERALS) TABS tablet, Take 1 tablet by mouth daily., Disp: , Rfl:  .  ondansetron (ZOFRAN) 4 MG  tablet, Take 1 tablet (4 mg total) by mouth every 8 (eight) hours as needed for nausea or vomiting., Disp: 20 tablet, Rfl: 0 .  tiZANidine (ZANAFLEX) 4 MG tablet, Take 1 tablet (4 mg total) by mouth every 8 (eight) hours as needed for muscle spasms., Disp: 30 tablet, Rfl: 0 .  Vitamin E 100 units TABS, Take by mouth. , Disp: , Rfl:  .  albuterol (VENTOLIN HFA) 108 (90 Base) MCG/ACT inhaler, Inhale into the lungs. (Patient not taking: Reported on 05/17/2020), Disp: , Rfl:   Allergies  Allergen Reactions  . Aches-Pains Medicine [Traumeel]     Other reaction(s): Unknown  . Cough-Cold Medicine [Phenir-Pe-Sod Sal-Caff Cit] Other (See Comments)    Numbness of face  . Montelukast Other (See Comments)    Dryness of the mouth. Rx has become ineffective  . Other Other (See Comments)    OTC cold meds and cough meds Causes face numbness     Varicose veins of leg with pain, right     PLAN: The patient's right lower extremity was sterilely prepped and draped. The ultrasound machine was used to visualize the saphenous vein throughout its course. A segment in the mid calf was selected for access.  This was the location for access  as there were about 8 to 10 cm of the vein before the saphenous vein itself became small and atretic and a superficial branch provided flow in the thigh area.  This was far too superficial and tortuous for a laser ablation.  The saphenous vein itself did not appear to regain patency until the proximal thigh through branches and a segment likely too short to perform laser ablation.  These branches will need to be addressed with foam sclerotherapy at a later date.  The saphenous vein was accessed without difficulty using ultrasound guidance with a micropuncture needle. A 0.018 wire was then placed beyond the saphenofemoral junction and the needle was removed. The 65 cm sheath was then placed over the wire and the wire and dilator were removed. The laser fiber was then placed through  the sheath and its tip was placed to the level of the large collateral branch where the saphenous vein became small.  This was roughly the level of the knee. Tumescent anesthesia was then created with a dilute lidocaine solution. Laser energy was then delivered with constant withdrawal of the sheath and laser fiber. Approximately 420 joules of energy were delivered over a length of 9 centimeters using a 1470 Hz VenaCure machine at 7 W. Sterile dressings were placed. The patient tolerated the procedure well without obvious complications.   Follow-up in 1 week with post-laser duplex.

## 2020-11-03 ENCOUNTER — Ambulatory Visit (INDEPENDENT_AMBULATORY_CARE_PROVIDER_SITE_OTHER): Payer: BC Managed Care – PPO

## 2020-11-03 ENCOUNTER — Other Ambulatory Visit: Payer: Self-pay

## 2020-11-03 DIAGNOSIS — I83811 Varicose veins of right lower extremities with pain: Secondary | ICD-10-CM | POA: Diagnosis not present

## 2020-11-24 ENCOUNTER — Encounter (INDEPENDENT_AMBULATORY_CARE_PROVIDER_SITE_OTHER): Payer: Self-pay | Admitting: Nurse Practitioner

## 2020-11-24 ENCOUNTER — Ambulatory Visit (INDEPENDENT_AMBULATORY_CARE_PROVIDER_SITE_OTHER): Payer: BC Managed Care – PPO | Admitting: Nurse Practitioner

## 2020-11-24 ENCOUNTER — Other Ambulatory Visit: Payer: Self-pay

## 2020-11-24 VITALS — BP 113/72 | HR 77 | Ht 60.0 in | Wt 174.0 lb

## 2020-11-24 DIAGNOSIS — E781 Pure hyperglyceridemia: Secondary | ICD-10-CM | POA: Diagnosis not present

## 2020-11-24 DIAGNOSIS — I83811 Varicose veins of right lower extremities with pain: Secondary | ICD-10-CM | POA: Diagnosis not present

## 2020-11-26 NOTE — Progress Notes (Signed)
Subjective:    Patient ID: Andrea Hudson, female    DOB: December 08, 1960, 60 y.o.   MRN: [id number] Chief Complaint  Patient presents with  . Follow-up    Follow up 4 wk post laser     The patient returns to the office for followup status post laser ablation of the right great saphenous vein on 10/27/2020.  This was a redo ablation due to some recannulization of certain areas of her great saphenous vein..  The patient note significant improvement in the lower extremity pain but not resolution of the symptoms. The patient notes multiple residual varicosities bilaterally which continued to hurt with dependent positions and remained tender to palpation. The patient's swelling is minimally from preoperative status. The patient continues to wear graduated compression stockings on a daily basis but these are not eliminating the pain and discomfort. The patient continues to use over-the-counter anti-inflammatory medications to treat the pain and related symptoms but this has not given the patient relief. The patient notes the pain in the lower extremities is causing problems with daily exercise, problems at work and even with household activities such as preparing meals and doing dishes.  The patient is otherwise done well and there have been no complications related to the laser procedure or interval changes in the patient's overall   Post laser ultrasound shows successful ablation of the right great saphenous vein.   Review of Systems  Hematological: Bruises/bleeds easily.  All other systems reviewed and are negative.      Objective:   Physical Exam Vitals reviewed.  HENT:     Head: Normocephalic.  Cardiovascular:     Rate and Rhythm: Normal rate.     Pulses: Normal pulses.  Pulmonary:     Effort: Pulmonary effort is normal.  Skin:    Comments: Scattered spider varicosities  Neurological:     Mental Status: She is alert and oriented to person, place, and time.  Psychiatric:        Mood  and Affect: Mood normal.        Behavior: Behavior normal.        Thought Content: Thought content normal.        Judgment: Judgment normal.     BP 113/72   Pulse 77   Ht 5' (1.524 m)   Wt 174 lb (78.9 kg)   BMI 33.98 kg/m   Past Medical History:  Diagnosis Date  . Chicken pox   . Depression   . Hypertension   . Hypothyroidism   . Obesity   . Rectocele     Social History   Socioeconomic History  . Marital status: Married    Spouse name: Not on file  . Number of children: Not on file  . Years of education: Not on file  . Highest education level: Not on file  Occupational History  . Not on file  Tobacco Use  . Smoking status: Never Smoker  . Smokeless tobacco: Never Used  Substance and Sexual Activity  . Alcohol use: No  . Drug use: No  . Sexual activity: Not on file  Other Topics Concern  . Not on file  Social History Narrative  . Not on file   Social Determinants of Health   Financial Resource Strain: Not on file  Food Insecurity: Not on file  Transportation Needs: Not on file  Physical Activity: Not on file  Stress: Not on file  Social Connections: Not on file  Intimate Partner Violence: Not on file  Past Surgical History:  Procedure Laterality Date  . ABCESS DRAINAGE    . ABDOMINAL HYSTERECTOMY    . COLONOSCOPY WITH PROPOFOL N/A 10/23/2015   Procedure: COLONOSCOPY WITH PROPOFOL;  Surgeon: Christena Deem, MD;  Location: [facility] ENDOSCOPY;  Service: Endoscopy;  Laterality: N/A;  . DG 2ND DIDGIT LEFT FOOT Left   . FRACTURE SURGERY    . LASER ABLATION Bilateral   . NEUROPLASTY / TRANSPOSITION MEDIAN NERVE AT CARPAL TUNNEL Bilateral   . phlebectomy    . TUBAL LIGATION      Family History  Problem Relation Age of Onset  . Breast cancer Neg Hx     Allergies  Allergen Reactions  . Aches-Pains Medicine [Traumeel]     Other reaction(s): Unknown  . Cough-Cold Medicine [Phenir-Pe-Sod Sal-Caff Cit] Other (See Comments)    Numbness of face  .  Montelukast Other (See Comments)    Dryness of the mouth. Rx has become ineffective  . Other Other (See Comments)    OTC cold meds and cough meds Causes face numbness    CBC Latest Ref Rng & Units 03/13/2016  WBC 3.6 - 11.0 K/uL 6.1  Hemoglobin 12.0 - 16.0 g/dL 42.5  Hematocrit 95.6 - 47.0 % 40.2  Platelets 150 - 440 K/uL 259      CMP     Component Value Date/Time   NA 137 03/13/2016 1155   K 4.0 03/13/2016 1155   CL 104 03/13/2016 1155   CO2 25 03/13/2016 1155   GLUCOSE 117 (H) 03/13/2016 1155   BUN 12 03/13/2016 1155   CREATININE 0.61 03/13/2016 1155   CALCIUM 9.0 03/13/2016 1155   PROT 7.8 03/13/2016 1155   ALBUMIN 4.3 03/13/2016 1155   AST 64 (H) 03/13/2016 1155   ALT 66 (H) 03/13/2016 1155   ALKPHOS 76 03/13/2016 1155   BILITOT 0.2 (L) 03/13/2016 1155   GFRNONAA >60 03/13/2016 1155   GFRAA >60 03/13/2016 1155     No results found.     Assessment & Plan:   1. Varicose veins of leg with pain, right Recommend:  The patient is complaining of varicose veins.    I have had a long discussion with the patient regarding  varicose veins and why they cause symptoms.  Patient will begin wearing graduated compression stockings on a daily basis, beginning first thing in the morning and removing them in the evening. The patient is instructed specifically not to sleep in the stockings.    The patient  will also begin using over-the-counter analgesics such as Motrin 600 mg po TID to help control the symptoms as needed.    In addition, behavioral modification including elevation during the day will be initiated, utilizing a recliner was recommended.  The patient is also instructed to continue exercising such as walking 4-5 times per week.  At this time the patient wishes to continue conservative therapy and is not interested in more invasive treatments such as laser ablation and sclerotherapy.  The Patient will follow up PRN if the symptoms worsen.  2.  Hypertriglyceridemia Continue statin as ordered and reviewed, no changes at this time    Current Outpatient Medications on File Prior to Visit  Medication Sig Dispense Refill  . cetirizine (ZYRTEC) 10 MG tablet Take 10 mg by mouth daily.    . cholecalciferol (VITAMIN D) 1000 units tablet Take 1,000 Units by mouth daily.     . citalopram (CELEXA) 10 MG tablet Take 10 mg by mouth daily.    Marland Kitchen ibuprofen (  ADVIL) 400 MG tablet     . ibuprofen (ADVIL) 800 MG tablet Take 1 tablet (800 mg total) by mouth every 8 (eight) hours as needed. 30 tablet 1  . levothyroxine (SYNTHROID) 88 MCG tablet Take 88 mcg by mouth every morning.    Marland Kitchen levothyroxine (SYNTHROID, LEVOTHROID) 75 MCG tablet Take 75 mcg by mouth daily before breakfast.    . loperamide (IMODIUM) 2 MG capsule Take 2 mg by mouth as needed for diarrhea or loose stools.    . meloxicam (MOBIC) 15 MG tablet Take 1 tablet (15 mg total) by mouth daily as needed for pain. 30 tablet 0  . montelukast (SINGULAIR) 10 MG tablet Take 10 mg by mouth at bedtime.    . Multiple Vitamin (MULTIVITAMIN WITH MINERALS) TABS tablet Take 1 tablet by mouth daily.    . ondansetron (ZOFRAN) 4 MG tablet Take 1 tablet (4 mg total) by mouth every 8 (eight) hours as needed for nausea or vomiting. 20 tablet 0  . tiZANidine (ZANAFLEX) 4 MG tablet Take 1 tablet (4 mg total) by mouth every 8 (eight) hours as needed for muscle spasms. 30 tablet 0  . Vitamin E 100 units TABS Take by mouth.     Marland Kitchen albuterol (VENTOLIN HFA) 108 (90 Base) MCG/ACT inhaler Inhale into the lungs. (Patient not taking: Reported on 05/17/2020)    . ALPRAZolam (XANAX) 0.5 MG tablet Take by mouth. (Patient not taking: Reported on 11/24/2020)     No current facility-administered medications on file prior to visit.    There are no Patient Instructions on file for this visit. No follow-ups on file.   Georgiana Spinner, NP

## 2021-07-23 ENCOUNTER — Other Ambulatory Visit (INDEPENDENT_AMBULATORY_CARE_PROVIDER_SITE_OTHER): Payer: Self-pay | Admitting: Vascular Surgery

## 2021-11-29 ENCOUNTER — Other Ambulatory Visit: Payer: Self-pay | Admitting: Internal Medicine

## 2021-11-29 DIAGNOSIS — Z1231 Encounter for screening mammogram for malignant neoplasm of breast: Secondary | ICD-10-CM

## 2021-12-24 ENCOUNTER — Ambulatory Visit
Admission: RE | Admit: 2021-12-24 | Discharge: 2021-12-24 | Disposition: A | Discharge status: Home / Self Care | Payer: BC Managed Care – PPO | Source: Ambulatory Visit | Attending: Internal Medicine | Admitting: Internal Medicine

## 2021-12-24 DIAGNOSIS — Z1231 Encounter for screening mammogram for malignant neoplasm of breast: Secondary | ICD-10-CM | POA: Diagnosis present

## 2021-12-28 ENCOUNTER — Other Ambulatory Visit: Payer: Self-pay | Admitting: Internal Medicine

## 2021-12-28 DIAGNOSIS — N6489 Other specified disorders of breast: Secondary | ICD-10-CM

## 2021-12-28 DIAGNOSIS — R928 Other abnormal and inconclusive findings on diagnostic imaging of breast: Secondary | ICD-10-CM

## 2022-01-25 ENCOUNTER — Ambulatory Visit
Admission: RE | Admit: 2022-01-25 | Discharge: 2022-01-25 | Disposition: A | Discharge status: Home / Self Care | Payer: BC Managed Care – PPO | Source: Ambulatory Visit | Attending: Internal Medicine | Admitting: Internal Medicine

## 2022-01-25 DIAGNOSIS — R928 Other abnormal and inconclusive findings on diagnostic imaging of breast: Secondary | ICD-10-CM | POA: Diagnosis present

## 2022-01-25 DIAGNOSIS — N6489 Other specified disorders of breast: Secondary | ICD-10-CM

## 2022-10-23 ENCOUNTER — Ambulatory Visit
Admission: RE | Admit: 2022-10-23 | Discharge: 2022-10-23 | Disposition: A | Discharge status: Home / Self Care | Payer: BC Managed Care – PPO | Source: Ambulatory Visit | Attending: Internal Medicine | Admitting: Internal Medicine

## 2022-10-23 ENCOUNTER — Other Ambulatory Visit: Payer: Self-pay | Admitting: Internal Medicine

## 2022-10-23 DIAGNOSIS — R200 Anesthesia of skin: Secondary | ICD-10-CM

## 2022-10-23 DIAGNOSIS — R600 Localized edema: Secondary | ICD-10-CM

## 2022-12-16 ENCOUNTER — Other Ambulatory Visit (INDEPENDENT_AMBULATORY_CARE_PROVIDER_SITE_OTHER): Payer: BC Managed Care – PPO

## 2022-12-16 ENCOUNTER — Encounter (INDEPENDENT_AMBULATORY_CARE_PROVIDER_SITE_OTHER): Payer: Self-pay | Admitting: Nurse Practitioner

## 2022-12-16 ENCOUNTER — Other Ambulatory Visit (INDEPENDENT_AMBULATORY_CARE_PROVIDER_SITE_OTHER): Payer: Self-pay | Admitting: Nurse Practitioner

## 2022-12-16 ENCOUNTER — Ambulatory Visit (INDEPENDENT_AMBULATORY_CARE_PROVIDER_SITE_OTHER): Payer: BC Managed Care – PPO | Admitting: Nurse Practitioner

## 2022-12-16 VITALS — BP 123/80 | HR 83 | Resp 18 | Ht 60.0 in | Wt 174.2 lb

## 2022-12-16 DIAGNOSIS — M7989 Other specified soft tissue disorders: Secondary | ICD-10-CM

## 2022-12-16 DIAGNOSIS — I83813 Varicose veins of bilateral lower extremities with pain: Secondary | ICD-10-CM

## 2022-12-16 DIAGNOSIS — M4310 Spondylolisthesis, site unspecified: Secondary | ICD-10-CM

## 2022-12-17 NOTE — Progress Notes (Signed)
Subjective:    Patient ID: Andrea Hudson, female    DOB: Apr 03, 1961, 62 y.o.   MRN: [id number] Chief Complaint  Patient presents with   Follow-up    . consult. (REFLUX STUDY IN EPIC - 09/2022) c/o RLE swelling/pressure/numbness.    The patient returns to the office for followup status post laser ablation of the bilateral great saphenous veins in 2021.  The patient note significant improvement in the lower extremity pain but not resolution of the symptoms. The patient notes multiple residual varicosities bilaterally which continued to hurt with dependent positions and remained tender to palpation. The patient's swelling is minimally from preoperative status. The patient continues to wear graduated compression stockings on a daily basis but these are not eliminating the pain and discomfort. The patient continues to use over-the-counter anti-inflammatory medications to treat the pain and related symptoms but this has not given the patient relief. The patient notes the pain in the lower extremities is causing problems with daily exercise, problems at work and even with household activities such as preparing meals and doing dishes.  He also endorses having numbness in the right lower extremity that is worse when she has had swelling.  The patient is otherwise done well and there have been no complications related to the laser procedure or interval changes in the patient's overall   Post laser ultrasound shows successful ablation of the bilateral great saphenous veins.  The patient has evidence of reflux in the great saphenous vein in the mid and evidence of deep venous insufficiency on the left.  She has triphasic tibial artery waveforms throughout her bilateral lower extremities.     Review of Systems  Cardiovascular:  Positive for leg swelling.  Neurological:  Positive for numbness.  All other systems reviewed and are negative.      Objective:   Physical Exam Vitals reviewed.  HENT:      Head: Normocephalic.  Cardiovascular:     Rate and Rhythm: Normal rate.     Pulses: Normal pulses.          Dorsalis pedis pulses are 2+ on the right side and 2+ on the left side.  Pulmonary:     Effort: Pulmonary effort is normal.  Musculoskeletal:        General: Tenderness present.  Skin:    General: Skin is warm and dry.  Neurological:     Mental Status: She is alert and oriented to person, place, and time.  Psychiatric:        Mood and Affect: Mood normal.        Behavior: Behavior normal.        Thought Content: Thought content normal.        Judgment: Judgment normal.     BP 123/80 (BP Location: Right Arm)   Pulse 83   Resp 18   Ht 5' (1.524 m)   Wt 174 lb 3.2 oz (79 kg)   BMI 34.02 kg/m   Past Medical History:  Diagnosis Date   Chicken pox    Depression    Hypertension    Hypothyroidism    Obesity    Rectocele     Social History   Socioeconomic History   Marital status: Married    Spouse name: Not on file   Number of children: Not on file   Years of education: Not on file   Highest education level: Not on file  Occupational History   Not on file  Tobacco Use   Smoking  status: Never   Smokeless tobacco: Never  Substance and Sexual Activity   Alcohol use: No   Drug use: No   Sexual activity: Not on file  Other Topics Concern   Not on file  Social History Narrative   Not on file   Social Determinants of Health   Financial Resource Strain: Not on file  Food Insecurity: Not on file  Transportation Needs: Not on file  Physical Activity: Not on file  Stress: Not on file  Social Connections: Not on file  Intimate Partner Violence: Not on file    Past Surgical History:  Procedure Laterality Date   ABCESS DRAINAGE     ABDOMINAL HYSTERECTOMY     COLONOSCOPY WITH PROPOFOL N/A 10/23/2015   Procedure: COLONOSCOPY WITH PROPOFOL;  Surgeon: Christena Deem, MD;  Location: [facility] ENDOSCOPY;  Service: Endoscopy;  Laterality: N/A;   DG 2ND DIDGIT LEFT  FOOT Left    FRACTURE SURGERY     LASER ABLATION Bilateral    NEUROPLASTY / TRANSPOSITION MEDIAN NERVE AT CARPAL TUNNEL Bilateral    phlebectomy     TUBAL LIGATION      Family History  Problem Relation Age of Onset   Breast cancer Neg Hx     Allergies  Allergen Reactions   Aches-Pains Medicine [Traumeel]     Other reaction(s): Unknown   Cough-Cold Medicine [Phenir-Pe-Sod Sal-Caff Cit] Other (See Comments)    Numbness of face   Montelukast Other (See Comments)    Dryness of the mouth. Rx has become ineffective   Other Other (See Comments)    OTC cold meds and cough meds Causes face numbness       Latest Ref Rng & Units 03/13/2016   11:55 AM  CBC  WBC 3.6 - 11.0 K/uL 6.1   Hemoglobin 12.0 - 16.0 g/dL 16.1   Hematocrit 09.6 - 47.0 % 40.2   Platelets 150 - 440 K/uL 259       CMP     Component Value Date/Time   NA 137 03/13/2016 1155   K 4.0 03/13/2016 1155   CL 104 03/13/2016 1155   CO2 25 03/13/2016 1155   GLUCOSE 117 (H) 03/13/2016 1155   BUN 12 03/13/2016 1155   CREATININE 0.61 03/13/2016 1155   CALCIUM 9.0 03/13/2016 1155   PROT 7.8 03/13/2016 1155   ALBUMIN 4.3 03/13/2016 1155   AST 64 (H) 03/13/2016 1155   ALT 66 (H) 03/13/2016 1155   ALKPHOS 76 03/13/2016 1155   BILITOT 0.2 (L) 03/13/2016 1155   GFRNONAA >60 03/13/2016 1155     No results found.     Assessment & Plan:   1. Varicose veins of leg with pain, bilateral Recommend:  The patient has had successful ablation of the previously incompetent saphenous venous system but still has persistent symptoms of pain and swelling that are having a negative impact on daily life and daily activities.  Patient should undergo injection sclerotherapy to treat the residual varicosities.  The risks, benefits and alternative therapies were reviewed in detail with the patient.  All questions were answered.  The patient agrees to proceed with sclerotherapy at their convenience.  The patient will continue  wearing the graduated compression stockings and using the over-the-counter pain medications to treat her symptoms.      2. Acquired spondylolisthesis The patient's symptoms certainly can be attributed to his varicosities however if posttreatment she continues to have issues I suspect some may have some component of lower back issues.   Current  Outpatient Medications on File Prior to Visit  Medication Sig Dispense Refill   aspirin EC 81 MG tablet Take 81 mg by mouth daily. Swallow whole.     cetirizine (ZYRTEC) 10 MG tablet Take 10 mg by mouth daily.     cholecalciferol (VITAMIN D) 1000 units tablet Take 1,000 Units by mouth daily.      citalopram (CELEXA) 10 MG tablet Take 10 mg by mouth daily.     gabapentin (NEURONTIN) 100 MG capsule Take 100 mg by mouth 2 (two) times daily.     IFEREX 150 150 MG capsule Take 150 mg by mouth daily.     levothyroxine (SYNTHROID) 88 MCG tablet Take 88 mcg by mouth every morning.     levothyroxine (SYNTHROID, LEVOTHROID) 75 MCG tablet Take 75 mcg by mouth daily before breakfast.     loperamide (IMODIUM) 2 MG capsule Take 2 mg by mouth as needed for diarrhea or loose stools.     Multiple Vitamin (MULTIVITAMIN WITH MINERALS) TABS tablet Take 1 tablet by mouth daily.     torsemide (DEMADEX) 10 MG tablet Take 1 tablet by mouth daily.     Vitamin E 100 units TABS Take by mouth.      ibuprofen (ADVIL) 400 MG tablet  (Patient not taking: Reported on 12/16/2022)     No current facility-administered medications on file prior to visit.    There are no Patient Instructions on file for this visit. No follow-ups on file.   Georgiana Spinner, NP

## 2023-03-10 ENCOUNTER — Other Ambulatory Visit: Payer: Self-pay | Admitting: Internal Medicine

## 2023-03-10 DIAGNOSIS — Z1231 Encounter for screening mammogram for malignant neoplasm of breast: Secondary | ICD-10-CM

## 2023-03-19 ENCOUNTER — Telehealth (INDEPENDENT_AMBULATORY_CARE_PROVIDER_SITE_OTHER): Payer: Self-pay

## 2023-03-19 NOTE — Telephone Encounter (Signed)
I had interpreter services call pt and LVM on both phone numbers for pt to call back to our office.  Trying to schedule her saline sclero appts.  Will try to call again later

## 2023-03-25 ENCOUNTER — Ambulatory Visit
Admission: RE | Admit: 2023-03-25 | Discharge: 2023-03-25 | Disposition: A | Discharge status: Home / Self Care | Payer: BC Managed Care – PPO | Source: Ambulatory Visit | Attending: Internal Medicine | Admitting: Internal Medicine

## 2023-03-25 DIAGNOSIS — Z1231 Encounter for screening mammogram for malignant neoplasm of breast: Secondary | ICD-10-CM | POA: Insufficient documentation

## 2023-04-23 ENCOUNTER — Ambulatory Visit (INDEPENDENT_AMBULATORY_CARE_PROVIDER_SITE_OTHER): Payer: BC Managed Care – PPO | Admitting: Nurse Practitioner

## 2023-04-23 ENCOUNTER — Encounter (INDEPENDENT_AMBULATORY_CARE_PROVIDER_SITE_OTHER): Payer: Self-pay | Admitting: Nurse Practitioner

## 2023-04-23 VITALS — BP 136/80 | HR 97 | Resp 18 | Ht 59.0 in | Wt 177.6 lb

## 2023-04-23 DIAGNOSIS — I83812 Varicose veins of left lower extremities with pain: Secondary | ICD-10-CM | POA: Diagnosis not present

## 2023-04-23 NOTE — Progress Notes (Signed)
Varicose veins of bilateral  lower extremity with inflammation (454.1  I83.10) Current Plans   Indication: Patient presents with symptomatic varicose veins of the bilateral  lower extremity.   Procedure: Sclerotherapy using hypertonic saline mixed with 1% Lidocaine was performed on the bilateral lower extremity. Compression wraps were placed. The patient tolerated the procedure well. 

## 2023-05-23 ENCOUNTER — Encounter (INDEPENDENT_AMBULATORY_CARE_PROVIDER_SITE_OTHER): Payer: Self-pay

## 2023-05-23 ENCOUNTER — Ambulatory Visit (INDEPENDENT_AMBULATORY_CARE_PROVIDER_SITE_OTHER): Payer: BC Managed Care – PPO | Admitting: Nurse Practitioner

## 2023-06-23 ENCOUNTER — Encounter (INDEPENDENT_AMBULATORY_CARE_PROVIDER_SITE_OTHER): Payer: Self-pay | Admitting: Nurse Practitioner

## 2023-06-23 ENCOUNTER — Ambulatory Visit (INDEPENDENT_AMBULATORY_CARE_PROVIDER_SITE_OTHER): Payer: BC Managed Care – PPO | Admitting: Nurse Practitioner

## 2023-06-23 VITALS — BP 139/80 | HR 89 | Resp 16 | Wt 174.4 lb

## 2023-06-23 DIAGNOSIS — I83812 Varicose veins of left lower extremities with pain: Secondary | ICD-10-CM

## 2023-06-23 NOTE — Progress Notes (Signed)
Varicose veins of bilateral  lower extremity with inflammation (454.1  I83.10) Current Plans   Indication: Patient presents with symptomatic varicose veins of the bilateral  lower extremity.   Procedure: Sclerotherapy using hypertonic saline mixed with 1% Lidocaine was performed on the bilateral lower extremity. Compression wraps were placed. The patient tolerated the procedure well. 

## 2023-11-18 ENCOUNTER — Encounter (INDEPENDENT_AMBULATORY_CARE_PROVIDER_SITE_OTHER): Payer: Self-pay

## 2023-12-27 ENCOUNTER — Encounter: Payer: Self-pay | Admitting: Internal Medicine

## 2023-12-27 ENCOUNTER — Emergency Department

## 2023-12-27 ENCOUNTER — Observation Stay

## 2023-12-27 ENCOUNTER — Observation Stay
Admission: EM | Admit: 2023-12-27 | Discharge: 2023-12-28 | Disposition: A | Discharge status: Home / Self Care | Attending: Emergency Medicine | Admitting: Emergency Medicine

## 2023-12-27 ENCOUNTER — Other Ambulatory Visit: Payer: Self-pay

## 2023-12-27 DIAGNOSIS — J45909 Unspecified asthma, uncomplicated: Secondary | ICD-10-CM | POA: Diagnosis not present

## 2023-12-27 DIAGNOSIS — Z7982 Long term (current) use of aspirin: Secondary | ICD-10-CM | POA: Diagnosis not present

## 2023-12-27 DIAGNOSIS — R0609 Other forms of dyspnea: Secondary | ICD-10-CM | POA: Diagnosis not present

## 2023-12-27 DIAGNOSIS — E039 Hypothyroidism, unspecified: Secondary | ICD-10-CM | POA: Diagnosis present

## 2023-12-27 DIAGNOSIS — M7989 Other specified soft tissue disorders: Secondary | ICD-10-CM | POA: Insufficient documentation

## 2023-12-27 DIAGNOSIS — J209 Acute bronchitis, unspecified: Principal | ICD-10-CM | POA: Insufficient documentation

## 2023-12-27 DIAGNOSIS — R2241 Localized swelling, mass and lump, right lower limb: Secondary | ICD-10-CM | POA: Insufficient documentation

## 2023-12-27 DIAGNOSIS — E781 Pure hyperglyceridemia: Secondary | ICD-10-CM | POA: Diagnosis present

## 2023-12-27 LAB — BASIC METABOLIC PANEL WITH GFR
Anion gap: 8 (ref 5–15)
BUN: 9 mg/dL (ref 8–23)
CO2: 28 mmol/L (ref 22–32)
Calcium: 9.4 mg/dL (ref 8.9–10.3)
Chloride: 105 mmol/L (ref 98–111)
Creatinine, Ser: 0.86 mg/dL (ref 0.44–1.00)
GFR, Estimated: >60 mL/min (ref >60)
Glucose, Bld: 151 mg/dL — ABNORMAL HIGH (ref 70–99)
Potassium: 3.8 mmol/L (ref 3.5–5.1)
Sodium: 141 mmol/L (ref 135–145)

## 2023-12-27 LAB — CBC
HCT: 40.4 % (ref 36.0–46.0)
Hemoglobin: 12.7 g/dL (ref 12.0–15.0)
MCH: 26.8 pg (ref 26.0–34.0)
MCHC: 31.4 g/dL (ref 30.0–36.0)
MCV: 85.4 fL (ref 80.0–100.0)
Platelets: 265 10*3/uL (ref 150–400)
RBC: 4.73 MIL/uL (ref 3.87–5.11)
RDW: 14.1 % (ref 11.5–15.5)
WBC: 4.5 10*3/uL (ref 4.0–10.5)
nRBC: 0 % (ref 0.0–0.2)

## 2023-12-27 LAB — TROPONIN I (HIGH SENSITIVITY): Troponin I (High Sensitivity): 5 ng/L (ref <18)

## 2023-12-27 LAB — MAGNESIUM: Magnesium: 2 mg/dL (ref 1.7–2.4)

## 2023-12-27 MED ORDER — IPRATROPIUM-ALBUTEROL 0.5-2.5 (3) MG/3ML IN SOLN
3.0000 mL | Freq: Four times a day (QID) | RESPIRATORY_TRACT | Status: AC
  Administered 2023-12-27 – 2023-12-28 (×2): 3 mL via RESPIRATORY_TRACT
  Filled 2023-12-27 (×2): qty 3

## 2023-12-27 MED ORDER — IPRATROPIUM-ALBUTEROL 0.5-2.5 (3) MG/3ML IN SOLN
3.0000 mL | Freq: Once | RESPIRATORY_TRACT | Status: AC
  Administered 2023-12-27: 3 mL via RESPIRATORY_TRACT
  Filled 2023-12-27: qty 3

## 2023-12-27 MED ORDER — HEPARIN SODIUM (PORCINE) 5000 UNIT/ML IJ SOLN
5000.0000 [IU] | Freq: Three times a day (TID) | INTRAMUSCULAR | Status: DC
  Administered 2023-12-27 – 2023-12-28 (×2): 5000 [IU] via SUBCUTANEOUS
  Filled 2023-12-27 (×2): qty 1

## 2023-12-27 MED ORDER — ONDANSETRON HCL 4 MG/2ML IJ SOLN: 4.0000 mg | Freq: Four times a day (QID) | INTRAMUSCULAR | Status: DC | PRN

## 2023-12-27 MED ORDER — HYDRALAZINE HCL 20 MG/ML IJ SOLN: 5.0000 mg | Freq: Four times a day (QID) | INTRAMUSCULAR | Status: DC | PRN

## 2023-12-27 MED ORDER — LEVOTHYROXINE SODIUM 88 MCG PO TABS
88.0000 ug | ORAL_TABLET | Freq: Every day | ORAL | Status: DC
  Administered 2023-12-28: 88 ug via ORAL
  Filled 2023-12-27: qty 1

## 2023-12-27 MED ORDER — ACETAMINOPHEN 325 MG PO TABS
650.0000 mg | ORAL_TABLET | Freq: Four times a day (QID) | ORAL | Status: DC | PRN
  Administered 2023-12-28: 650 mg via ORAL
  Filled 2023-12-27: qty 2

## 2023-12-27 MED ORDER — FUROSEMIDE 10 MG/ML IJ SOLN
20.0000 mg | Freq: Once | INTRAMUSCULAR | Status: AC
  Administered 2023-12-27: 20 mg via INTRAVENOUS
  Filled 2023-12-27: qty 4

## 2023-12-27 MED ORDER — ACETAMINOPHEN 650 MG RE SUPP
650.0000 mg | Freq: Four times a day (QID) | RECTAL | Status: DC | PRN
Start: 2023-12-27 — End: 2024-01-01

## 2023-12-27 MED ORDER — SENNOSIDES-DOCUSATE SODIUM 8.6-50 MG PO TABS
1.0000 | ORAL_TABLET | Freq: Every evening | ORAL | Status: DC | PRN
Start: 2023-12-27 — End: 2023-12-28

## 2023-12-27 MED ORDER — METHYLPREDNISOLONE SODIUM SUCC 125 MG IJ SOLR
125.0000 mg | Freq: Once | INTRAMUSCULAR | Status: AC
  Administered 2023-12-27: 125 mg via INTRAVENOUS
  Filled 2023-12-27: qty 2

## 2023-12-27 MED ORDER — ONDANSETRON HCL 4 MG PO TABS: 4.0000 mg | ORAL_TABLET | Freq: Four times a day (QID) | ORAL | Status: DC | PRN

## 2023-12-27 NOTE — ED Provider Notes (Signed)
 [facility] Provider Note    Event Date/Time   First MD Initiated Contact with Patient 12/27/23 1058     (approximate)   History   Chest Pain  Family member asked to interpret for her HPI  Andrea YNIGUEZ is a 63 y.o. female with no reported history of asthma, does not smoke cigarettes who presents with complaints of shortness of breath, cough.  Patient has been treated for bronchospasm with bronchitis with prednisone  and albuterol, she reports symptoms have not improved     Physical Exam   Triage Vital Signs: ED Triage Vitals  Encounter Vitals Group     BP 12/27/23 1055 (!) 157/86     Girls Systolic BP Percentile --      Girls Diastolic BP Percentile --      Boys Systolic BP Percentile --      Boys Diastolic BP Percentile --      Pulse Rate 12/27/23 1055 (!) 107     Resp 12/27/23 1055 18     Temp 12/27/23 1056 99.1 F (37.3 C)     Temp Source 12/27/23 1056 Oral     SpO2 12/27/23 1055 94 %     Weight 12/27/23 1056 79.1 kg (174 lb 6.1 oz)     Height 12/27/23 1056 1.499 m (4' 11)     Head Circumference --      Peak Flow --      Pain Score 12/27/23 1055 8     Pain Loc --      Pain Education --      Exclude from Growth Chart --     Most recent vital signs: Vitals:   12/27/23 1056 12/27/23 1113  BP:    Pulse:    Resp:    Temp: 99.1 F (37.3 C)   SpO2:  95%     General: Awake, CV:  Good peripheral perfusion.  Resp:  Mild tachypnea, audible wheezing Abd:  No distention.  Other:  No lower extremity edema.   ED Results / Procedures / Treatments   Labs (all labs ordered are listed, but only abnormal results are displayed) Labs Reviewed  BASIC METABOLIC PANEL WITH GFR - Abnormal; Notable for the following components:      Result Value   Glucose, Bld 151 (*)    All other components within normal limits  CBC  TROPONIN I (HIGH SENSITIVITY)     EKG  ED ECG REPORT I, Lamar Price, the attending physician, personally viewed  and interpreted this ECG.  Date: 12/27/2023 EKG Time:  Rate:  Rhythm: normal sinus rhythm QRS Axis: normal Intervals: normal ST/T Wave abnormalities: normal Narrative Interpretation: no evidence of acute ischemia    RADIOLOGY Chest x-ray viewed interpret by me, no acute abnormality    PROCEDURES:  Critical Care performed:   Procedures   MEDICATIONS ORDERED IN ED: Medications  methylPREDNISolone  sodium succinate (SOLU-MEDROL ) 125 mg/2 mL injection 125 mg (125 mg Intravenous Given 12/27/23 1208)  ipratropium-albuterol (DUONEB) 0.5-2.5 (3) MG/3ML nebulizer solution 3 mL (3 mLs Nebulization Given 12/27/23 1208)  ipratropium-albuterol (DUONEB) 0.5-2.5 (3) MG/3ML nebulizer solution 3 mL (3 mLs Nebulization Given 12/27/23 1208)  ipratropium-albuterol (DUONEB) 0.5-2.5 (3) MG/3ML nebulizer solution 3 mL (3 mLs Nebulization Given 12/27/23 1301)     IMPRESSION / MDM / ASSESSMENT AND PLAN / ED COURSE  I reviewed the triage vital signs and the nursing notes. Patient's presentation is most consistent with acute presentation with potential threat to life or bodily function.  Patient  presents with shortness of breath, she is tachycardic and mildly tachypneic.  She has audible wheezing on exam.  Highly consistent with bronchospasm/asthma/COPD, she denies a history of cigarette use.  Will treat with IV Solu-Medrol , DuoNebs and reevaluate  Lab work is reassuring, chest x-ray without evidence of pneumonia or pneumothorax.  Additional DuoNeb ordered  ----------------------------------------- 1:51 PM on 12/27/2023 ----------------------------------------- Patient still with significant wheezing, poor airflow, oxygen saturations hovering around 90%, she will require admission for further treatment and management, will consult the hospitalist team        FINAL CLINICAL IMPRESSION(S) / ED DIAGNOSES   Final diagnoses:  Bronchitis, acute, with bronchospasm     Rx / DC Orders   ED  Discharge Orders     None        Note:  This document was prepared using Dragon voice recognition software and may include unintentional dictation errors.   Arlander Charleston, MD 12/27/23 1351

## 2023-12-27 NOTE — ED Triage Notes (Signed)
 First nurse note: Pt to ED from [facility]. Pt reports SOB, CP, HA and wheezing for a few days. KC reports saw pt on 6/10 and given steroids and today they feel she is not improving and sent for further evaluation.

## 2023-12-27 NOTE — Assessment & Plan Note (Signed)
 Etiology workup in progress Patient states does not feel improved after albuterol treatments, Z-Pak, cough medication and DuoNeb treatment in the hospital I suspect this patient may have heart failure, new onset Strict I's and O's Complete echo ordered Lasix 20 mg IV one-time dose ordered on admission

## 2023-12-27 NOTE — Assessment & Plan Note (Signed)
 With 3+ pitting edema Right lower extremity ultrasound to assess for DVT ordered on admission

## 2023-12-27 NOTE — Assessment & Plan Note (Signed)
 -  This complicates overall care and prognosis.

## 2023-12-27 NOTE — H&P (Signed)
 History and Physical   Andrea Hudson FMW:[id number] DOB: 1961-06-15 DOA: 12/27/2023  PCP: Sadie Manna, MD  Patient coming from: Outpatient [facility]  I have personally briefly reviewed patient's old medical records in [facility] EMR.  Chief Concern: Shortness of breath  HPI: Ms. Andrea Hudson is a 63 year old female with history of obesity, hypothyroid, hypertension, presents emergency department for chief concerns of shortness of breath.  Vitals in the ED showed t 99.1, respiration 18, heart rate 107, blood pressure 157/86, SpO2 94% on room air.  Serum sodium is 141, potassium 3.8, chloride 105, bicarb 28, BUN 9, serum creatinine 0.86, EGFR greater than 60, nonfasting blood glucose 151, WBC 4.5, hemoglobin 2.7, platelets of 265.  ED treatment: DuoNebs 3 treatments, Solu-Medrol  125 mg IV one-time dose. --------------------------------- At bedside, patient is able to tell me her first and last name, age, location.  The remainder of the exam was done with her husband as interpreter service per her request.  Patient reports she has been feeling shortness of breath for nearly 1 month.  She reports she has been compliant with home medication including inhaler treatment, antibiotic, and steroid and has had minimal improvement.  Patient further reports that her right lower extremity has been swollen since April 2024.  She denies trauma to her person.  She reports the left lower extremity has minimal trace swelling.  She further reports the chest tightness, shortness of breath is worse with exertion.  She reports swelling of her right lower extremity is also worse with ambulation and being on her feet.  Social history: Patient lives at home with her husband.  She denies tobacco, EtOH, recreational drug use.  ROS: Constitutional: no weight change, no fever ENT/Mouth: no sore throat, no rhinorrhea Eyes: no eye pain, no vision changes Cardiovascular: + chest pain, + dyspnea,  +  edema, no palpitations Respiratory: no cough, no sputum, no wheezing Gastrointestinal: no nausea, no vomiting, no diarrhea, no constipation Genitourinary: no urinary incontinence, no dysuria, no hematuria Musculoskeletal: no arthralgias, no myalgias Skin: no skin lesions, no pruritus, Neuro: + weakness, no loss of consciousness, no syncope Psych: no anxiety, no depression, + decrease appetite Heme/Lymph: no bruising, no bleeding  ED Course: Discussed with EDP, patient requiring hospitalization for chief concerns of COPD exacerbation.  Assessment/Plan  Principal Problem:   Dyspnea on exertion Active Problems:   Hypertriglyceridemia   Hypothyroidism   Morbid obesity (HCC)   Right leg swelling   Assessment and Plan:  * Dyspnea on exertion Etiology workup in progress Patient states does not feel improved after albuterol treatments, Z-Pak, cough medication and DuoNeb treatment in the hospital I suspect this patient may have heart failure, new onset Strict I's and O's Complete echo ordered Lasix 20 mg IV one-time dose ordered on admission  Right leg swelling With 3+ pitting edema Right lower extremity ultrasound to assess for DVT ordered on admission  Morbid obesity (HCC) This complicates overall care and prognosis.   Hypothyroidism Home levothyroxine 88 mcg daily resumed  Chart reviewed.   DVT prophylaxis: Heparin 5000 units subcutaneous every 8 hours Code Status: Full code Diet: Heart healthy Family Communication: Updated spouse at bedside with patient's permission Disposition Plan: Pending clinical course Consults called: None at this time, a.m. team to consider consultation pending complete echo Admission status: Telemetry cardiac, observation  Past Medical History:  Diagnosis Date   Chicken pox    Depression    Hypertension    Hypothyroidism    Obesity    Rectocele  Past Surgical History:  Procedure Laterality Date   ABCESS DRAINAGE     ABDOMINAL  HYSTERECTOMY     COLONOSCOPY WITH PROPOFOL  N/A 10/23/2015   Procedure: COLONOSCOPY WITH PROPOFOL ;  Surgeon: Gladis RAYMOND Mariner, MD;  Location: [facility] ENDOSCOPY;  Service: Endoscopy;  Laterality: N/A;   DG 2ND DIDGIT LEFT FOOT Left    FRACTURE SURGERY     LASER ABLATION Bilateral    NEUROPLASTY / TRANSPOSITION MEDIAN NERVE AT CARPAL TUNNEL Bilateral    phlebectomy     TUBAL LIGATION     Social History:  reports that she has never smoked. She has never used smokeless tobacco. She reports that she does not drink alcohol and does not use drugs.  Allergies  Allergen Reactions   Aches-Pains Medicine [Traumeel]     Other reaction(s): Unknown   Cough-Cold Medicine [Phenir-Pe-Sod Sal-Caff Cit] Other (See Comments)    Numbness of face   Montelukast Other (See Comments)    Dryness of the mouth. Rx has become ineffective   Other Other (See Comments)    OTC cold meds and cough meds Causes face numbness   Family History  Problem Relation Age of Onset   Diabetes Mother    Breast cancer Neg Hx    Family history: Family history reviewed and not pertinent.  Prior to Admission medications   Medication Sig Start Date End Date Taking? Authorizing Provider  aspirin EC 81 MG tablet Take 81 mg by mouth daily. Swallow whole.    [provider]  cetirizine (ZYRTEC) 10 MG tablet Take 10 mg by mouth daily.    [provider]  cholecalciferol (VITAMIN D) 1000 units tablet Take 1,000 Units by mouth daily.     [provider]  citalopram (CELEXA) 10 MG tablet Take 10 mg by mouth daily. 09/30/19   [provider]  gabapentin (NEURONTIN) 100 MG capsule Take 100 mg by mouth 2 (two) times daily.    [provider]  ibuprofen  (ADVIL ) 400 MG tablet     [provider]  IFEREX 150 150 MG capsule Take 150 mg by mouth daily.    [provider]  levothyroxine (SYNTHROID) 88 MCG tablet Take 88 mcg by mouth every morning. 10/15/20   [provider]   levothyroxine (SYNTHROID, LEVOTHROID) 75 MCG tablet Take 75 mcg by mouth daily before breakfast.    [provider]  loperamide (IMODIUM) 2 MG capsule Take 2 mg by mouth as needed for diarrhea or loose stools.    [provider]  Multiple Vitamin (MULTIVITAMIN WITH MINERALS) TABS tablet Take 1 tablet by mouth daily.    [provider]  torsemide (DEMADEX) 10 MG tablet Take 1 tablet by mouth daily. 12/05/22 12/05/23  [provider]  Vitamin E 100 units TABS Take by mouth.     [provider]   Physical Exam: Vitals:   12/27/23 1055 12/27/23 1056 12/27/23 1113  BP: (!) 157/86    Pulse: (!) 107    Resp: 18    Temp:  99.1 F (37.3 C)   TempSrc:  Oral   SpO2: 94%  95%  Weight:  79.1 kg   Height:  4' 11 (1.499 m)    Constitutional: appears age-appropriate, NAD, calm Eyes: PERRL, lids and conjunctivae normal ENMT: Mucous membranes are moist. Posterior pharynx clear of any exudate or lesions. Age-appropriate dentition. Hearing appropriate Neck: normal, supple, no masses, no thyromegaly Respiratory: clear to auscultation bilaterally, + diffuse generalized end expiratory wheezing, no crackles. Normal respiratory  effort.  Increased accessory muscle use.  Cardiovascular: Regular rate and rhythm, no murmurs / rubs / gallops.  Right lower extremity 3+ pitting edema. 2+ pedal pulses. No carotid bruits.  Abdomen: no tenderness, no masses palpated, no hepatosplenomegaly. Bowel sounds positive.  Musculoskeletal: no clubbing / cyanosis. No joint deformity upper and lower extremities. Good ROM, no contractures, no atrophy. Normal muscle tone.  Skin: no rashes, lesions, ulcers. No induration Neurologic: Sensation intact. Strength 5/5 in all 4.  Psychiatric: Normal judgment and insight. Alert and oriented x 3. Normal mood.   EKG: independently reviewed, showing sinus tachycardia with rate of 107, QTc 315  Chest x-ray on Admission: I personally reviewed and I  agree with radiologist reading as below.  DG Chest 2 View Result Date: 12/27/2023 CLINICAL DATA:  Chest tightness EXAM: CHEST - 2 VIEW COMPARISON:  03/13/2016 FINDINGS: Lungs are clear.  No pneumothorax. Heart size and mediastinal contours are within normal limits. No effusion. Visualized bones unremarkable. IMPRESSION: No acute cardiopulmonary disease. Electronically Signed   By: JONETTA Faes M.D.   On: 12/27/2023 11:51   Labs on Admission: I have personally reviewed following labs  CBC: Recent Labs  Lab 12/27/23 1109  WBC 4.5  HGB 12.7  HCT 40.4  MCV 85.4  PLT 265   Basic Metabolic Panel: Recent Labs  Lab 12/27/23 1109  NA 141  K 3.8  CL 105  CO2 28  GLUCOSE 151*  BUN 9  CREATININE 0.86  CALCIUM 9.4  MG 2.0   GFR: Estimated Creatinine Clearance: 62.5 mL/min (by C-G formula based on SCr of 0.86 mg/dL).  This document was prepared using Dragon Voice Recognition software and may include unintentional dictation errors.  Dr. Sherre [facility]  If 7PM-7AM, please contact overnight-coverage provider If 7AM-7PM, please contact day attending provider [email or web address]  12/27/2023, 2:34 PM

## 2023-12-27 NOTE — ED Notes (Signed)
 Called lab at this time to add on mag level.

## 2023-12-27 NOTE — Hospital Course (Addendum)
 Ms. Andrea Hudson is a 63 year old female with history of obesity, hypothyroid, hypertension, presents emergency department for chief concerns of shortness of breath.  Vitals in the ED showed t 99.1, respiration 18, heart rate 107, blood pressure 157/86, SpO2 94% on room air.  Serum sodium is 141, potassium 3.8, chloride 105, bicarb 28, BUN 9, serum creatinine 0.86, EGFR greater than 60, nonfasting blood glucose 151, WBC 4.5, hemoglobin 2.7, platelets of 265.  ED treatment: DuoNebs 3 treatments, Solu-Medrol  125 mg IV one-time dose.

## 2023-12-27 NOTE — ED Notes (Signed)
 Labs sent at this time.

## 2023-12-27 NOTE — ED Notes (Signed)
 Pt ambulatory to the bathroom. Gait steady. Stand by assist provided.

## 2023-12-27 NOTE — ED Triage Notes (Addendum)
 Pt here c/o persistent and worsening chest congestion, cough, shortness of breath for 3 weeks from her primary . Patient seen recently, treated antibiotics, prednisone , albuterol, cough med. Patient now worse. Pt has audible inspiratory wheezing as well. Pt states pain is centered and does not radiate. Pt endorses nausea but denies vomiting or diarrhea.

## 2023-12-27 NOTE — Assessment & Plan Note (Signed)
Home levothyroxine 88 mcg daily resumed

## 2023-12-28 ENCOUNTER — Observation Stay
Admit: 2023-12-28 | Discharge: 2023-12-28 | Disposition: A | Discharge status: Home / Self Care | Attending: Internal Medicine | Admitting: Internal Medicine

## 2023-12-28 DIAGNOSIS — M7989 Other specified soft tissue disorders: Secondary | ICD-10-CM | POA: Diagnosis not present

## 2023-12-28 DIAGNOSIS — E781 Pure hyperglyceridemia: Secondary | ICD-10-CM

## 2023-12-28 DIAGNOSIS — E039 Hypothyroidism, unspecified: Secondary | ICD-10-CM

## 2023-12-28 DIAGNOSIS — R0609 Other forms of dyspnea: Secondary | ICD-10-CM | POA: Diagnosis not present

## 2023-12-28 LAB — ECHOCARDIOGRAM COMPLETE
AR max vel: 3.14 cm2
AV Peak grad: 9.1 mmHg
Ao pk vel: 1.51 m/s
Area-P 1/2: 4.17 cm2
Height: 59 in
S' Lateral: 3.1 cm
Weight: 2634.94 [oz_av]

## 2023-12-28 LAB — CBC
HCT: 36.5 % (ref 36.0–46.0)
Hemoglobin: 11.7 g/dL — ABNORMAL LOW (ref 12.0–15.0)
MCH: 26.5 pg (ref 26.0–34.0)
MCHC: 32.1 g/dL (ref 30.0–36.0)
MCV: 82.6 fL (ref 80.0–100.0)
Platelets: 256 10*3/uL (ref 150–400)
RBC: 4.42 MIL/uL (ref 3.87–5.11)
RDW: 14.3 % (ref 11.5–15.5)
WBC: 9.8 10*3/uL (ref 4.0–10.5)
nRBC: 0 % (ref 0.0–0.2)

## 2023-12-28 LAB — BASIC METABOLIC PANEL WITH GFR
Anion gap: 8 (ref 5–15)
BUN: 13 mg/dL (ref 8–23)
CO2: 28 mmol/L (ref 22–32)
Calcium: 9.2 mg/dL (ref 8.9–10.3)
Chloride: 102 mmol/L (ref 98–111)
Creatinine, Ser: 0.61 mg/dL (ref 0.44–1.00)
GFR, Estimated: >60 mL/min (ref >60)
Glucose, Bld: 130 mg/dL — ABNORMAL HIGH (ref 70–99)
Potassium: 4 mmol/L (ref 3.5–5.1)
Sodium: 138 mmol/L (ref 135–145)

## 2023-12-28 LAB — BRAIN NATRIURETIC PEPTIDE: B Natriuretic Peptide: 21.1 pg/mL (ref 0.0–100.0)

## 2023-12-28 LAB — TSH: TSH: 0.517 u[IU]/mL (ref 0.350–4.500)

## 2023-12-28 NOTE — Progress Notes (Signed)
 Pt being discharged to home with family. PIV removed. Discharged paperwork reviewed and all questions and concerns answered.

## 2023-12-28 NOTE — Discharge Summary (Signed)
 Physician Discharge Summary   Patient: Andrea Hudson MRN: [id number] DOB: 12-14-1960  Admit date:     12/27/2023  Discharge date: 12/28/23  Discharge Physician: Amaryllis Dare   PCP: Andrea Manna, MD   Recommendations at discharge:  Follow-up with primary care provider  Discharge Diagnoses: Principal Problem:   Dyspnea on exertion Active Problems:   Hypertriglyceridemia   Hypothyroidism   Morbid obesity (HCC)   Right leg swelling   Hospital Course: Ms. Andrea Hudson is a 63 year old female with history of obesity, hypothyroid, hypertension, presents emergency department for chief concerns of shortness of breath with exertion for about a month.  Vitals in the ED showed t 99.1, respiration 18, heart rate 107, blood pressure 157/86, SpO2 94% on room air.  Serum sodium is 141, potassium 3.8, chloride 105, bicarb 28, BUN 9, serum creatinine 0.86, EGFR greater than 60, nonfasting blood glucose 151, WBC 4.5, hemoglobin 12.7, platelets of 265. Troponin negative, chest x-ray without any abnormality and lower extremity venous Doppler was negative for DVT.  ED treatment: DuoNebs 3 treatments, Solu-Medrol  125 mg IV one-time dose.  She was also given a dose of IV Lasix.  6/29: Vital and labs stable on room air.  BNP normal at 21, TSH normal.  Echocardiogram was normal.  Patient is overweight that might be contributory to her exertional dyspnea.  Last of the workup need to be done by PCP.  Patient should have baseline screening.  Patient will continue with her home medications and need to have a close follow-up with her providers for further assistance.  Assessment and Plan: * Dyspnea on exertion No obvious reason found.  No wheezing.  Echocardiogram normal.  Patient is most likely overweight causing the symptoms. Need to encourage weight loss, healthy eating and exercise Should have her basic screening done by primary care provider  Right leg swelling Chronic right lower extremity  edema, venous Doppler negative for DVT. She can continue home low-dose torsemide  Morbid obesity (HCC) This complicates overall care and prognosis.   Hypothyroidism TSH normal Home levothyroxine 88 mcg daily resumed  Consultants: None Procedures performed: None Disposition: Home Diet recommendation:  Discharge Diet Orders (From admission, onward)     Start     Ordered   12/28/23 0000  Diet - low sodium heart healthy        12/28/23 1249           Regular diet DISCHARGE MEDICATION: Allergies as of 12/28/2023       Reactions   Aches-pains Medicine [traumeel]    Other reaction(s): Unknown   Cough-cold Medicine [phenir-pe-sod Sal-caff Cit] Other (See Comments)   Numbness of face   Montelukast Other (See Comments)   Dryness of the mouth. Rx has become ineffective   Other Other (See Comments)   OTC cold meds and cough meds Causes face numbness        Medication List     STOP taking these medications    amoxicillin 875 MG tablet Commonly known as: AMOXIL   azithromycin 250 MG tablet Commonly known as: ZITHROMAX   citalopram 10 MG tablet Commonly known as: CELEXA   doxycycline 100 MG capsule Commonly known as: VIBRAMYCIN   gabapentin 100 MG capsule Commonly known as: NEURONTIN   ibuprofen  400 MG tablet Commonly known as: ADVIL    IFerex 150 150 MG capsule Generic drug: iron polysaccharides   loperamide 2 MG capsule Commonly known as: IMODIUM   traZODone 50 MG tablet Commonly known as: DESYREL   Vitamin E  100 units Tabs       TAKE these medications    albuterol 108 (90 Base) MCG/ACT inhaler Commonly known as: VENTOLIN HFA Inhale 2 puffs into the lungs every 6 (six) hours as needed.   aspirin EC 81 MG tablet Take 81 mg by mouth daily. Swallow whole.   cetirizine 10 MG tablet Commonly known as: ZYRTEC Take 10 mg by mouth daily.   chlorpheniramine-HYDROcodone 10-8 MG/5ML Commonly known as: TUSSIONEX Take 2.5-5 mLs by mouth at bedtime as  needed for cough.   cholecalciferol 1000 units tablet Commonly known as: VITAMIN D Take 1,000 Units by mouth daily.   levothyroxine 88 MCG tablet Commonly known as: SYNTHROID Take 88 mcg by mouth every morning. What changed: Another medication with the same name was removed. Continue taking this medication, and follow the directions you see here.   meloxicam  15 MG tablet Commonly known as: MOBIC  Take 15 mg by mouth daily. What changed: Another medication with the same name was removed. Continue taking this medication, and follow the directions you see here.   multivitamin with minerals Tabs tablet Take 1 tablet by mouth daily.   predniSONE  10 MG tablet Commonly known as: DELTASONE  Take 10 mg by mouth daily with breakfast.   torsemide 10 MG tablet Commonly known as: DEMADEX Take 1 tablet by mouth daily.        Follow-up Information     Andrea Manna, MD. Schedule an appointment as soon as possible for a visit in 1 week(s).   Specialty: Internal Medicine Contact information: [address] [place] [address] [phone]                Discharge Exam: Andrea Hudson   12/27/23 1056 12/28/23 0456 12/28/23 0500  Weight: 79.1 kg 79.3 kg 74.7 kg   General.  Obese lady, in no acute distress. Pulmonary.  Lungs clear bilaterally, normal respiratory effort. CV.  Regular rate and rhythm, no JVD, rub or murmur. Abdomen.  Soft, nontender, nondistended, BS positive. CNS.  Alert and oriented .  No focal neurologic deficit. Extremities.  No edema, no cyanosis, pulses intact and symmetrical. Psychiatry.  Judgment and insight appears normal.   Condition at discharge: stable  The results of significant diagnostics from this hospitalization (including imaging, microbiology, ancillary and laboratory) are listed below for reference.   Imaging Studies: ECHOCARDIOGRAM COMPLETE Result Date: 12/28/2023    ECHOCARDIOGRAM REPORT   Patient Name:    Andrea Hudson Date of Exam: 12/28/2023 Medical Rec #:  [id number]      Height:       59.0 in Accession #:    [id number]     Weight:       164.7 lb Date of Birth:  1960/08/16      BSA:          1.698 m Patient Age:    62 years       BP:           120/76 mmHg Patient Gender: F              HR:           84 bpm. Exam Location:  [facility] Procedure: 2D Echo, Cardiac Doppler and Color Doppler (Both Spectral and Color            Flow Doppler were utilized during procedure). Indications:     Dyspnea R06.00  History:         Patient has no prior history of  Echocardiogram examinations.  Sonographer:     Bernice Rubinstein RDCS Referring Phys:  [id number] AMY N COX Diagnosing Phys: Cara JONETTA Lovelace MD  Sonographer Comments: Image acquisition challenging due to respiratory motion. IMPRESSIONS  1. Left ventricular ejection fraction, by estimation, is 60 to 65%. The left ventricle has normal function. The left ventricle has no regional wall motion abnormalities. Left ventricular diastolic parameters were normal.  2. Right ventricular systolic function is normal. The right ventricular size is normal.  3. The mitral valve is normal in structure. Trivial mitral valve regurgitation.  4. The aortic valve is normal in structure. Aortic valve regurgitation is trivial. FINDINGS  Left Ventricle: Left ventricular ejection fraction, by estimation, is 60 to 65%. The left ventricle has normal function. The left ventricle has no regional wall motion abnormalities. Strain was performed and the global longitudinal strain is indeterminate. Global longitudinal strain performed but not reported based on interpreter judgement due to suboptimal tracking. The left ventricular internal cavity size was normal in size. There is no left ventricular hypertrophy. Left ventricular diastolic parameters were normal. Right Ventricle: The right ventricular size is normal. No increase in right ventricular wall thickness. Right ventricular systolic function is normal. Left  Atrium: Left atrial size was normal in size. Right Atrium: Right atrial size was normal in size. Pericardium: There is no evidence of pericardial effusion. Mitral Valve: The mitral valve is normal in structure. Trivial mitral valve regurgitation. Tricuspid Valve: The tricuspid valve is normal in structure. Tricuspid valve regurgitation is mild. Aortic Valve: The aortic valve is normal in structure. Aortic valve regurgitation is trivial. Aortic valve peak gradient measures 9.1 mmHg. Pulmonic Valve: The pulmonic valve was normal in structure. Pulmonic valve regurgitation is not visualized. Aorta: The ascending aorta was not well visualized. IAS/Shunts: No atrial level shunt detected by color flow Doppler. Additional Comments: 3D was performed not requiring image post processing on an independent workstation and was indeterminate.  LEFT VENTRICLE PLAX 2D LVIDd:         4.70 cm   Diastology LVIDs:         3.10 cm   LV e' medial:    7.72 cm/s LV PW:         1.00 cm   LV E/e' medial:  10.4 LV IVS:        1.00 cm   LV e' lateral:   12.20 cm/s LVOT diam:     2.10 cm   LV E/e' lateral: 6.6 LV SV:         88 LV SV Index:   52 LVOT Area:     3.46 cm  RIGHT VENTRICLE RV Basal diam:  3.10 cm RV S prime:     13.10 cm/s TAPSE (M-mode): 2.7 cm LEFT ATRIUM             Index        RIGHT ATRIUM           Index LA diam:        3.60 cm 2.12 cm/m   RA Area:     15.40 cm LA Vol (A2C):   52.0 ml 30.62 ml/m  RA Volume:   37.20 ml  21.91 ml/m LA Vol (A4C):   40.9 ml 24.08 ml/m LA Biplane Vol: 48.6 ml 28.62 ml/m  AORTIC VALVE                 PULMONIC VALVE AV Area (Vmax): 3.14 cm     PV Vmax:  0.91 m/s AV Vmax:        151.00 cm/s  PV Peak grad:     3.3 mmHg AV Peak Grad:   9.1 mmHg     PR End Diast Vel: 2.57 msec LVOT Vmax:      137.00 cm/s  RVOT Peak grad:   2 mmHg LVOT Vmean:     95.300 cm/s LVOT VTI:       0.253 m  AORTA Ao Root diam: 3.30 cm Ao Asc diam:  3.40 cm MITRAL VALVE MV Area (PHT): 4.17 cm    SHUNTS MV Decel  Time: 182 msec    Systemic VTI:  0.25 m MV E velocity: 80.40 cm/s  Systemic Diam: 2.10 cm MV A velocity: 75.20 cm/s MV E/A ratio:  1.07 Dwayne D Callwood MD Electronically signed by Cara JONETTA Lovelace MD Signature Date/Time: 12/28/2023/12:37:12 PM    Final    US  Venous Img Lower Unilateral Right (DVT) Result Date: 12/27/2023 CLINICAL DATA:  Right leg swelling. EXAM: RIGHT LOWER EXTREMITY VENOUS DOPPLER ULTRASOUND TECHNIQUE: Gray-scale sonography with compression, as well as color and duplex ultrasound, were performed to evaluate the deep venous system(s) from the level of the common femoral vein through the popliteal and proximal calf veins. COMPARISON:  None Available. FINDINGS: VENOUS Normal compressibility of the common femoral, superficial femoral, and popliteal veins, as well as the visualized calf veins. Visualized portions of profunda femoral vein and great saphenous vein unremarkable. No filling defects to suggest DVT on grayscale or color Doppler imaging. Doppler waveforms show normal direction of venous flow, normal respiratory plasticity and response to augmentation. Limited views of the contralateral common femoral vein are unremarkable. OTHER None. Limitations: none IMPRESSION: Negative. Electronically Signed   By: Leita Birmingham M.D.   On: 12/27/2023 16:10   DG Chest 2 View Result Date: 12/27/2023 CLINICAL DATA:  Chest tightness EXAM: CHEST - 2 VIEW COMPARISON:  03/13/2016 FINDINGS: Lungs are clear.  No pneumothorax. Heart size and mediastinal contours are within normal limits. No effusion. Visualized bones unremarkable. IMPRESSION: No acute cardiopulmonary disease. Electronically Signed   By: JONETTA Faes M.D.   On: 12/27/2023 11:51    Microbiology: No results found for this or any previous visit.  Labs: CBC: Recent Labs  Lab 12/27/23 1109 12/28/23 0451  WBC 4.5 9.8  HGB 12.7 11.7*  HCT 40.4 36.5  MCV 85.4 82.6  PLT 265 256   Basic Metabolic Panel: Recent Labs  Lab 12/27/23 1109  12/28/23 0451  NA 141 138  K 3.8 4.0  CL 105 102  CO2 28 28  GLUCOSE 151* 130*  BUN 9 13  CREATININE 0.86 0.61  CALCIUM 9.4 9.2  MG 2.0  --    Liver Function Tests: No results for input(s): AST, ALT, ALKPHOS, BILITOT, PROT, ALBUMIN in the last 168 hours. CBG: No results for input(s): GLUCAP in the last 168 hours.  Discharge time spent: greater than 30 minutes.  This record has been created using Conservation officer, historic buildings. Errors have been sought and corrected,but may not always be located. Such creation errors do not reflect on the standard of care.   Signed: Amaryllis Dare, MD [facility] 12/28/2023

## 2023-12-28 NOTE — Plan of Care (Signed)
  Problem: Education: Goal: Knowledge of General Education information will improve Description: Including pain rating scale, medication(s)/side effects and non-pharmacologic comfort measures Outcome: Progressing   Problem: Clinical Measurements: Goal: Will remain free from infection Outcome: Progressing Goal: Respiratory complications will improve Outcome: Progressing   Problem: Activity: Goal: Risk for activity intolerance will decrease Outcome: Progressing   Problem: Nutrition: Goal: Adequate nutrition will be maintained Outcome: Progressing   Problem: Safety: Goal: Ability to remain free from injury will improve Outcome: Progressing

## 2024-01-14 ENCOUNTER — Other Ambulatory Visit: Payer: Self-pay | Admitting: Emergency Medicine

## 2024-01-14 DIAGNOSIS — R0902 Hypoxemia: Secondary | ICD-10-CM

## 2024-01-14 DIAGNOSIS — J4540 Moderate persistent asthma, uncomplicated: Secondary | ICD-10-CM

## 2024-01-14 DIAGNOSIS — R0602 Shortness of breath: Secondary | ICD-10-CM

## 2024-01-19 ENCOUNTER — Ambulatory Visit
Admission: RE | Admit: 2024-01-19 | Discharge: 2024-01-19 | Disposition: A | Discharge status: Home / Self Care | Source: Ambulatory Visit | Attending: Emergency Medicine

## 2024-01-19 DIAGNOSIS — R0902 Hypoxemia: Secondary | ICD-10-CM

## 2024-01-19 DIAGNOSIS — R0602 Shortness of breath: Secondary | ICD-10-CM

## 2024-01-19 DIAGNOSIS — J4540 Moderate persistent asthma, uncomplicated: Secondary | ICD-10-CM

## 2024-04-22 ENCOUNTER — Other Ambulatory Visit: Payer: Self-pay | Admitting: Internal Medicine

## 2024-04-22 DIAGNOSIS — Z1231 Encounter for screening mammogram for malignant neoplasm of breast: Secondary | ICD-10-CM

## 2024-05-26 ENCOUNTER — Ambulatory Visit
Admission: RE | Admit: 2024-05-26 | Discharge: 2024-05-26 | Disposition: A | Discharge status: Home / Self Care | Source: Ambulatory Visit | Attending: Internal Medicine | Admitting: Internal Medicine

## 2024-05-26 DIAGNOSIS — Z1231 Encounter for screening mammogram for malignant neoplasm of breast: Secondary | ICD-10-CM | POA: Diagnosis present

## 2024-06-29 ENCOUNTER — Other Ambulatory Visit: Payer: Self-pay | Admitting: Internal Medicine

## 2024-06-29 ENCOUNTER — Ambulatory Visit
Admission: RE | Admit: 2024-06-29 | Discharge: 2024-06-29 | Disposition: A | Discharge status: Home / Self Care | Source: Ambulatory Visit | Attending: Internal Medicine | Admitting: Internal Medicine

## 2024-06-29 DIAGNOSIS — M7989 Other specified soft tissue disorders: Secondary | ICD-10-CM | POA: Insufficient documentation

## 2024-06-29 DIAGNOSIS — L03115 Cellulitis of right lower limb: Secondary | ICD-10-CM | POA: Insufficient documentation
